# Patient Record
Sex: Female | Born: 1975
Health system: Southern US, Community
[De-identification: ages and names within clinical notes are randomized; demographics above are authoritative.]

## PROBLEM LIST (undated history)

## (undated) DIAGNOSIS — F32A Depression, unspecified: Secondary | ICD-10-CM

## (undated) DIAGNOSIS — Z87442 Personal history of urinary calculi: Secondary | ICD-10-CM

## (undated) DIAGNOSIS — I10 Essential (primary) hypertension: Secondary | ICD-10-CM

## (undated) DIAGNOSIS — F419 Anxiety disorder, unspecified: Secondary | ICD-10-CM

## (undated) DIAGNOSIS — R0602 Shortness of breath: Secondary | ICD-10-CM

## (undated) DIAGNOSIS — R5383 Other fatigue: Secondary | ICD-10-CM

## (undated) DIAGNOSIS — N200 Calculus of kidney: Secondary | ICD-10-CM

## (undated) HISTORY — DX: Other fatigue: R53.83

## (undated) HISTORY — DX: Shortness of breath: R06.02

## (undated) HISTORY — DX: Calculus of kidney: N20.0

## (undated) HISTORY — DX: Anxiety disorder, unspecified: F41.9

## (undated) HISTORY — DX: Depression, unspecified: F32.A

## (undated) HISTORY — DX: Essential (primary) hypertension: I10

---

## 2000-10-18 ENCOUNTER — Other Ambulatory Visit: Admission: RE | Admit: 2000-10-18 | Discharge: 2000-10-18 | Payer: Self-pay | Admitting: Obstetrics & Gynecology

## 2000-12-12 ENCOUNTER — Other Ambulatory Visit: Admission: RE | Admit: 2000-12-12 | Discharge: 2000-12-12 | Payer: Self-pay | Admitting: Obstetrics & Gynecology

## 2001-02-18 ENCOUNTER — Encounter: Admission: RE | Admit: 2001-02-18 | Discharge: 2001-03-13 | Payer: Self-pay | Admitting: Obstetrics & Gynecology

## 2001-05-20 ENCOUNTER — Inpatient Hospital Stay (HOSPITAL_COMMUNITY): Admission: AD | Admit: 2001-05-20 | Discharge: 2001-05-23 | Payer: Self-pay | Admitting: Obstetrics & Gynecology

## 2001-07-03 ENCOUNTER — Other Ambulatory Visit: Admission: RE | Admit: 2001-07-03 | Discharge: 2001-07-03 | Payer: Self-pay | Admitting: Obstetrics & Gynecology

## 2005-06-18 ENCOUNTER — Encounter: Admission: RE | Admit: 2005-06-18 | Discharge: 2005-06-18 | Payer: Self-pay | Admitting: Family Medicine

## 2005-07-02 ENCOUNTER — Encounter: Admission: RE | Admit: 2005-07-02 | Discharge: 2005-07-02 | Payer: Self-pay | Admitting: Gastroenterology

## 2007-04-03 ENCOUNTER — Other Ambulatory Visit: Admission: RE | Admit: 2007-04-03 | Discharge: 2007-04-03 | Payer: Self-pay | Admitting: Obstetrics and Gynecology

## 2008-05-03 HISTORY — PX: KIDNEY STONE SURGERY: SHX686

## 2008-05-22 ENCOUNTER — Emergency Department (HOSPITAL_COMMUNITY): Admission: EM | Admit: 2008-05-22 | Discharge: 2008-05-22 | Payer: Self-pay | Admitting: Emergency Medicine

## 2008-05-23 ENCOUNTER — Emergency Department (HOSPITAL_COMMUNITY): Admission: EM | Admit: 2008-05-23 | Discharge: 2008-05-23 | Payer: Self-pay | Admitting: Emergency Medicine

## 2008-05-25 ENCOUNTER — Ambulatory Visit (HOSPITAL_COMMUNITY): Admission: RE | Admit: 2008-05-25 | Discharge: 2008-05-25 | Payer: Self-pay | Admitting: Urology

## 2008-05-28 ENCOUNTER — Observation Stay (HOSPITAL_COMMUNITY): Admission: EM | Admit: 2008-05-28 | Discharge: 2008-05-29 | Payer: Self-pay | Admitting: Emergency Medicine

## 2008-05-29 ENCOUNTER — Encounter (INDEPENDENT_AMBULATORY_CARE_PROVIDER_SITE_OTHER): Payer: Self-pay | Admitting: Urology

## 2008-05-31 ENCOUNTER — Ambulatory Visit (HOSPITAL_COMMUNITY): Admission: RE | Admit: 2008-05-31 | Discharge: 2008-05-31 | Payer: Self-pay | Admitting: Emergency Medicine

## 2008-06-03 ENCOUNTER — Emergency Department (HOSPITAL_COMMUNITY): Admission: EM | Admit: 2008-06-03 | Discharge: 2008-06-03 | Payer: Self-pay | Admitting: Emergency Medicine

## 2009-02-01 ENCOUNTER — Other Ambulatory Visit: Admission: RE | Admit: 2009-02-01 | Discharge: 2009-02-01 | Payer: Self-pay | Admitting: Gynecology

## 2009-02-01 ENCOUNTER — Ambulatory Visit: Payer: Self-pay | Admitting: Gynecology

## 2009-02-01 ENCOUNTER — Encounter: Payer: Self-pay | Admitting: Gynecology

## 2009-09-23 IMAGING — CT CT PELVIS W/O CM
1 of 2 series · 13 of 32 positions shown, 19 images · non-contrast
Comparison: 07/02/2005

 CT ABDOMEN

May 24, 2008 – Duplicate copy for exam association in [facility]. No change to original report.
CLINICAL DATA: Flank pain

 CT ABDOMEN AND PELVIS WITHOUT CONTRAST
TECHNIQUE: Multidetector CT imaging of the abdomen and pelvis was
 performed following the standard
 protocol without intravenous contrast.

[Series 2: abd|pel w/o 5.0 b40f · axial · non-contrast · 0.83mm/px · z∈[+444,+834]mm · 13 of 92 slices shown, 19 images]
[im 7/92  soft-tissue]
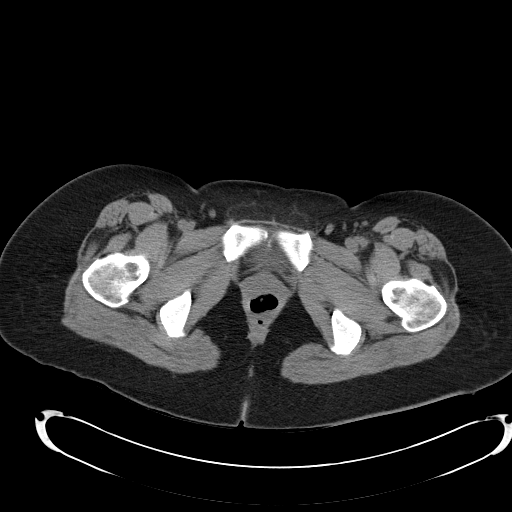
[im 7/92  bone]
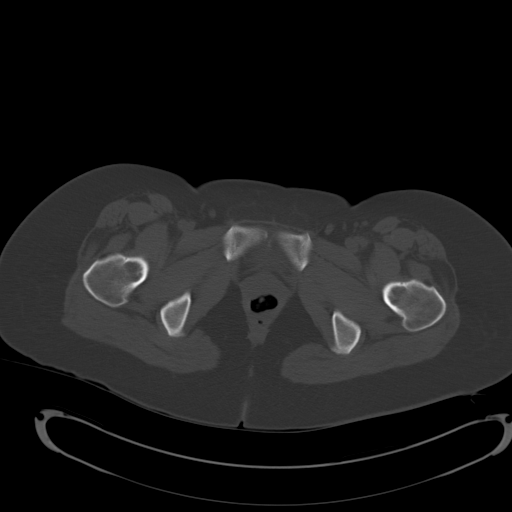
[im 13/92  soft-tissue]
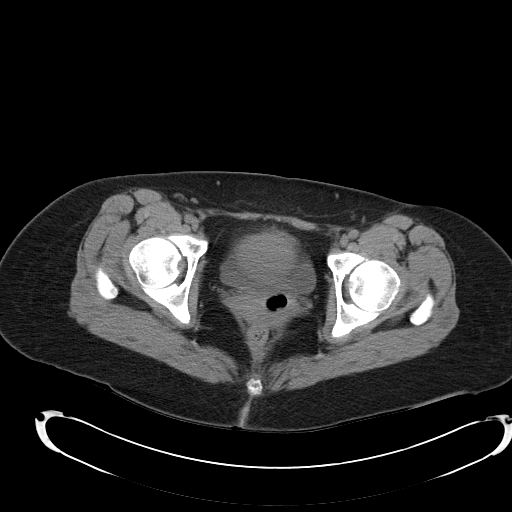
[im 19/92  soft-tissue]
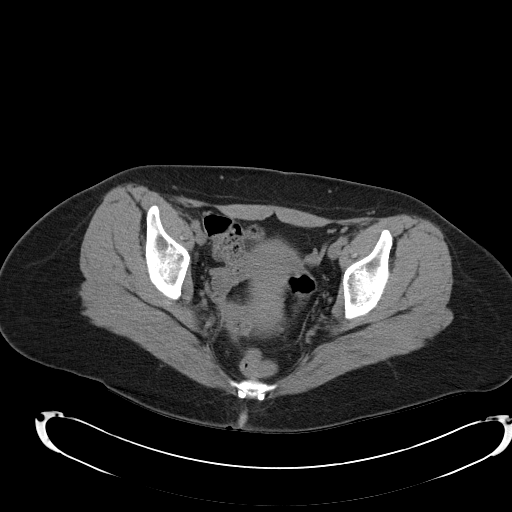
[im 25/92  soft-tissue]
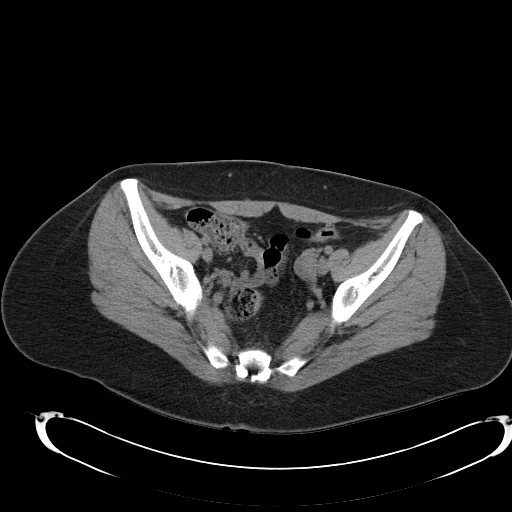
[im 31/92  soft-tissue]
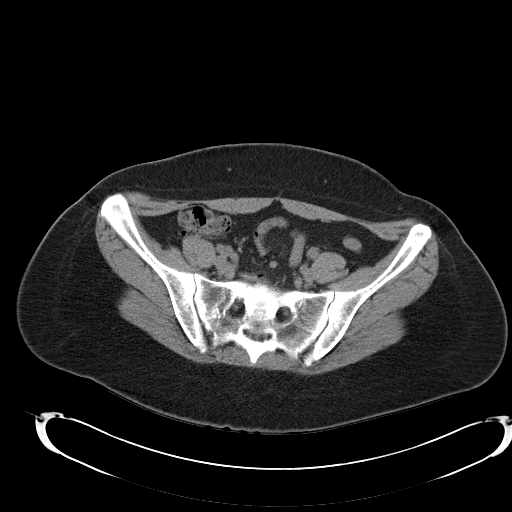
[im 37/92  soft-tissue]
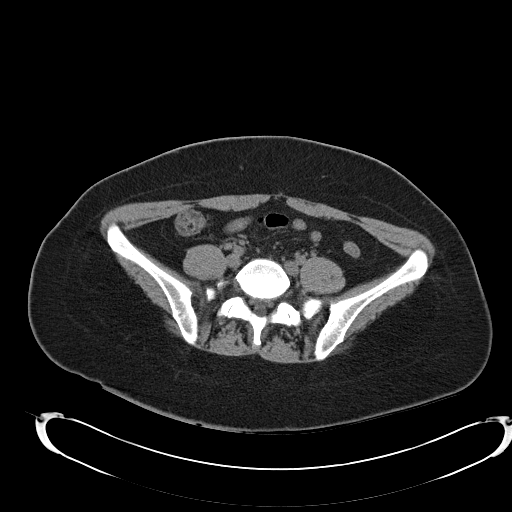
[im 49/92  soft-tissue]
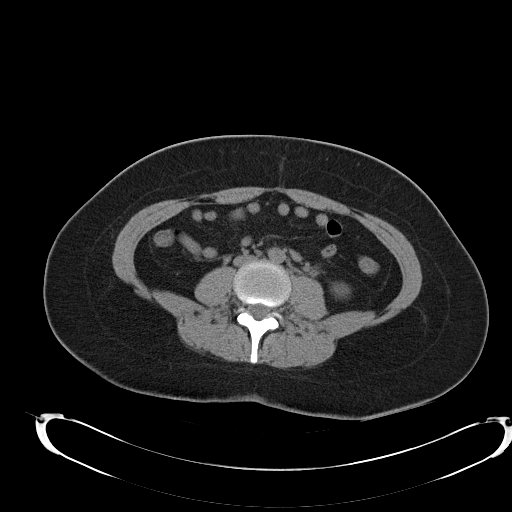
[im 55/92  soft-tissue]
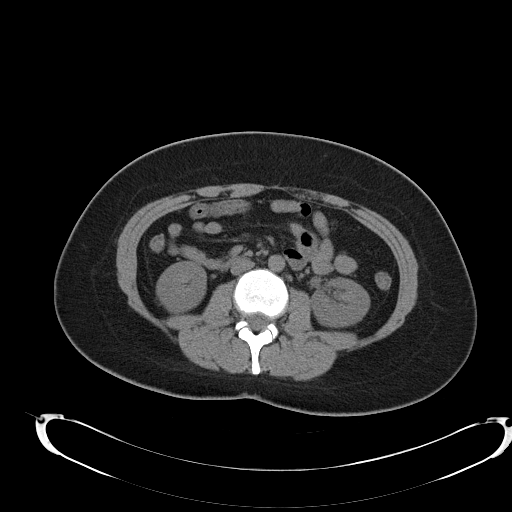
[im 61/92  soft-tissue]
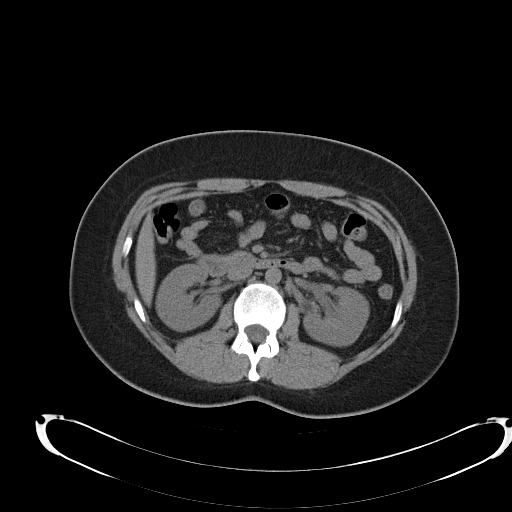
[im 61/92  bone]
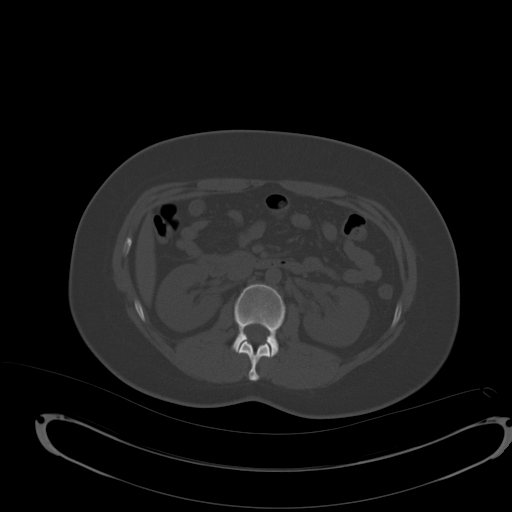
[im 67/92  soft-tissue]
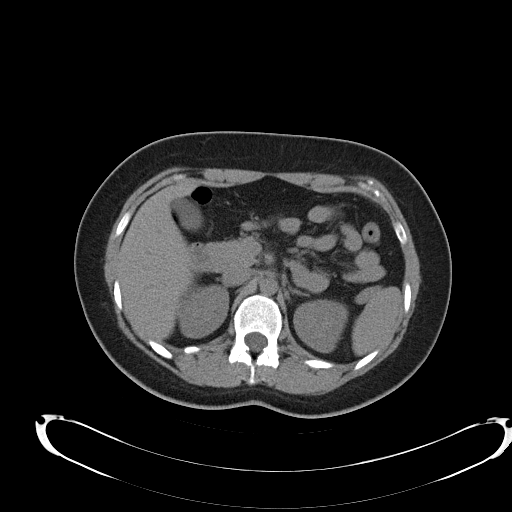
[im 67/92  lung]
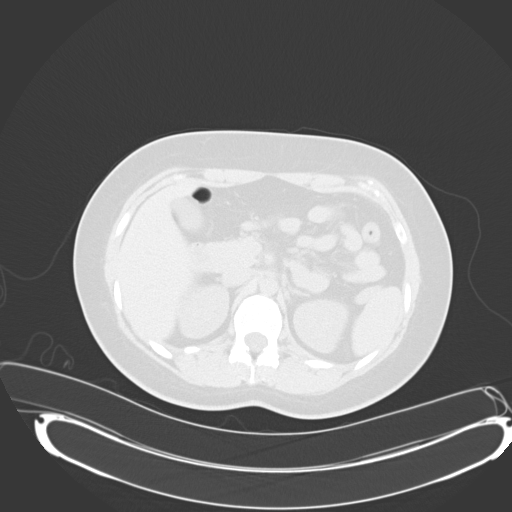
[im 73/92  soft-tissue]
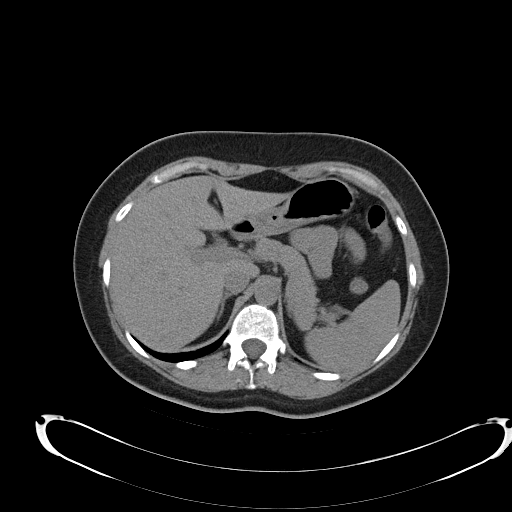
[im 73/92  lung]
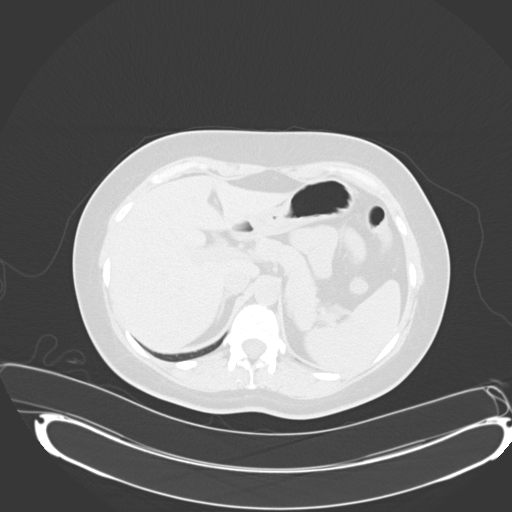
[im 79/92  soft-tissue]
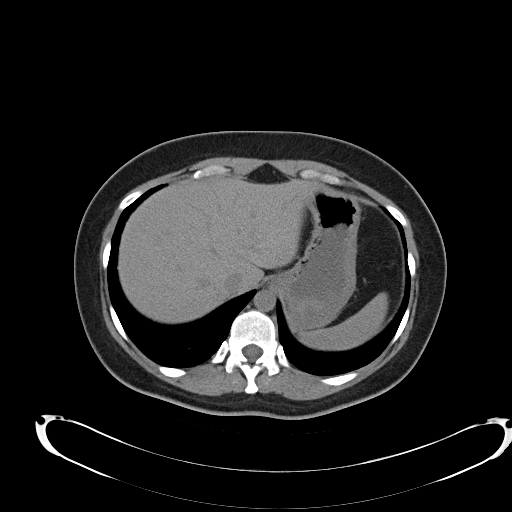
[im 79/92  lung]
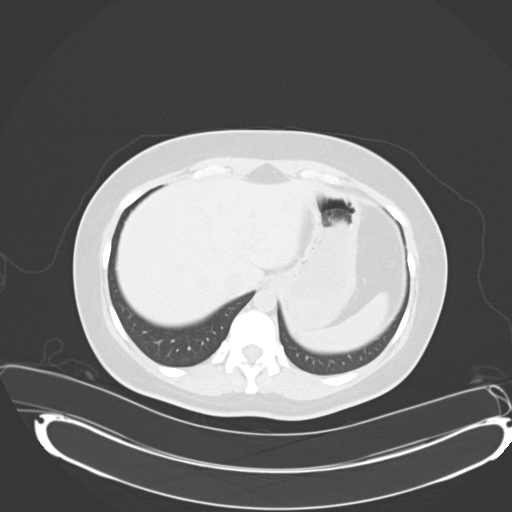
[im 85/92  soft-tissue]
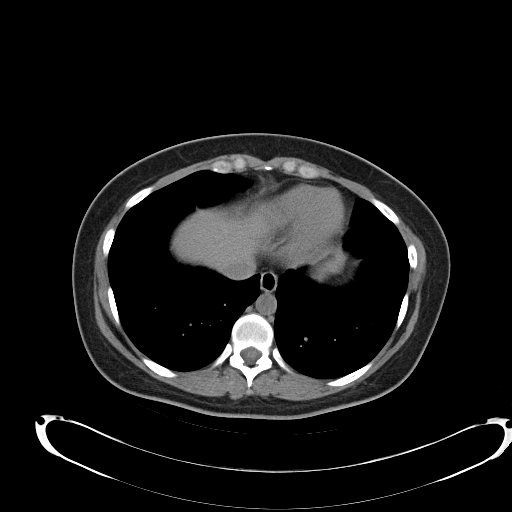
[im 85/92  lung]
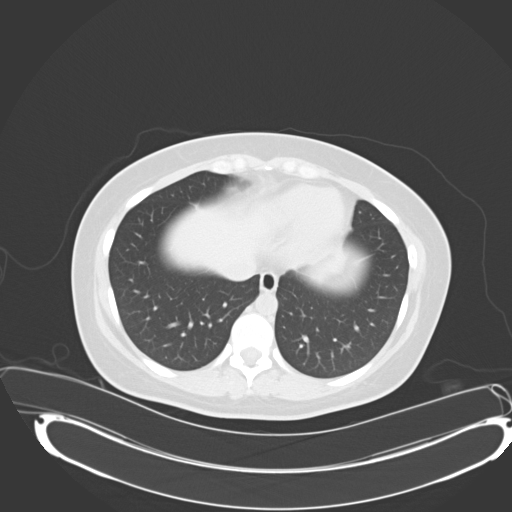

[13 of 32 positions shown; findings below may reference images not displayed]

FINDINGS: Visualized lung bases are clear.

 Liver is normal.

 Spleen is normal.

 The pancreas is normal.

 The adrenal glands are normal.

 There is a left-sided hydronephrosis. Within the proximal ureter
 there is a 4.5 mm stone near the UPJ.

 The right kidney is negative.

 Gallbladder is unremarkable by CT.

 No biliary ductal dilation.

 Stomach and visualized large and small bowel are unremarkable.

 Abdominal aorta normal is in caliber.

 No significant lymphadenopathy.

 No free fluid or abnormal fluid collections..
IMPRESSION: 1. Left-sided hydronephrosis secondary to a 4.5 mm left ureteral
 stone near the UPJ base.

 CT PELVIS
FINDINGS: Appendix identified and normal.

 Visualized colon and small bowel are unremarkable.

 No free fluid or abnormal fluid collections.

 No significant lymphadenopathy.

 Urinary bladder is normal.

 The uterus, and the adnexal structures are unremarkable. A tampon
 is identified within the vagina
IMPRESSION: 1. No acute pelvic CT findings

## 2010-02-09 ENCOUNTER — Other Ambulatory Visit: Admission: RE | Admit: 2010-02-09 | Discharge: 2010-02-09 | Payer: Self-pay | Admitting: Gynecology

## 2010-02-09 ENCOUNTER — Ambulatory Visit: Payer: Self-pay | Admitting: Gynecology

## 2010-12-24 ENCOUNTER — Encounter: Payer: Self-pay | Admitting: Gastroenterology

## 2011-04-17 NOTE — H&P (Signed)
NAMECHANCI, OJALA                ACCOUNT NO.:  [id number]   MEDICAL RECORD NO.:  [id number]          PATIENT TYPE:  OBV   LOCATION:  A326                          FACILITY:  [facility]   PHYSICIAN:  Dennie Maizes, M.D.   DATE OF BIRTH:  Apr 05, 1976   DATE OF ADMISSION:  05/28/2008  DATE OF DISCHARGE:  LH                              HISTORY & PHYSICAL   CHIEF COMPLAINT:  Severe left flank pain radiating to the front, nausea,  and vomiting.   HISTORY OF PRESENT ILLNESS:  This 35 year old female has been  experiencing intermittent severe left flank pain radiating to the front  for the past 1 week.  She has been to the emergency room x2.  She has  been evaluated with a CT scan of the abdomen and pelvis without contrast  as well as an x-ray of the KUB area.  The last x-ray revealed a 6 mm  size stone in the pelvic brim on the left side.  The patient was seen in  the office and treated with p.o. analgesics.  She did not have adequate  relief of pain with Percocet.  She started having severe recurrent pain  associated with nausea and vomiting today.  She came to the emergency  room for further evaluation.  An x-ray of the KUB area was repeated.  This revealed a 6 mm size stone in the left distal ureter at the level  of the ureterovesical junction.  The patient has been admitted to  hospital for pain control, IV fluids, and further treatment.  There is  no history of fever, chills, voiding difficulty, or gross hematuria at  present.  There is no past history of urolithiasis or urinary tract  infections.   PAST MEDICAL HISTORY:  No medical illnesses   MEDICATIONS:  Percocet.   ALLERGIES:  CODEINE.   FAMILY HISTORY:  Positive for cholelithiasis.   PHYSICAL EXAMINATION:  GENERAL:  The patient is in moderate discomfort.  She has received narcotics.  HEENT:  Normal.  NECK:  No masses.  LUNGS:  Clear to auscultation.  HEART:  Regular rate and rhythm.  No murmurs.  ABDOMEN:  Soft.  No  palpable flank mass.  Mild left costovertebral angle  tenderness is noted.  Bladder not palpable.   Urinalysis revealed a microscopic hematuria.   IMPRESSION:  Left distal ureteral calculus with obstruction, left  hydronephrosis, left renal colic.   PLAN:  I discussed the patient regarding the management options.  She  would like surgical removal of the stone.  She is scheduled to undergo  cystoscopy, left retrograde pyelogram, left ureteroscopy stone  extraction, and stent placement under anesthesia on May 29, 2008.  I  have explained to her regarding the diagnosis, operative details,  alternative treatments, outcome, possible risks and complications, and  she has agreed for the procedure to be done.  Stone migration during the  procedure as well as inability to remove the stone were explained to the  patient.  Her questions were answered.      Dennie Maizes, M.D.  Electronically Signed     SK/MEDQ  D:  05/28/2008  T:  05/28/2008  Job:  [id number]

## 2011-04-17 NOTE — Op Note (Signed)
Alison Lucas, Alison Lucas                ACCOUNT NO.:  [id number]   MEDICAL RECORD NO.:  [id number]          PATIENT TYPE:  OBV   LOCATION:  A326                          FACILITY:  [facility]   PHYSICIAN:  Dennie Maizes, M.D.   DATE OF BIRTH:  Jan 24, 1976   DATE OF PROCEDURE:  05/29/2008  DATE OF DISCHARGE:  05/29/2008                               OPERATIVE REPORT   PREOPERATIVE DIAGNOSES:  1. Left distal ureteral calculus with obstruction.  2. Left hydronephrosis.  3. Left renal colic.   POSTOPERATIVE DIAGNOSES:  1. Left distal ureteral calculus with obstruction.  2. Left hydronephrosis.  3. Left renal colic.   OPERATIVE PROCEDURE:  Cystoscopy, left retrograde pyelogram, left  ureteroscopy stone extraction, and left ureteral stent placement.   ANESTHESIA:  General.   SURGEON:  Dennie Maizes, MD.   COMPLICATIONS:  None.   ESTIMATED BLOOD LOSS:  Minimal.   SPECIMEN:  Left ureteral stone which was sent for chemical analysis.   INDICATIONS FOR PROCEDURE:  This is a 35 year old female who was  admitted to the hospital for severe left flank pain.  X-rays revealed a  3 x 5-mm size left distal ureteral calculi with obstruction and  hydronephrosis.  The patient was unable to pass the stone.  She was  taken to the operating room today for cystoscopy, left retrograde  pyelogram, left ureteroscopy, stone extraction, and left ureteral stent  placement.   DESCRIPTION OF PROCEDURE:  General anesthesia was induced and the  patient was placed on the OR table in the dorsal lithotomy position.  The lower abdomen and genitalia were prepped and draped in a sterile  fashion.  Cystoscopy was done with a 23-French scope.  Appearance of  bladder was normal.  A 5-French wedge catheter was then placed in the  left ureteral orifice and retrograde pyelogram was done by using about 7  mL of Renografin-60.  There was a filling defect in the distal ureter  about 6 x 5 mm in size.  There was proximal  hydroureter and  hydronephrosis.   A 5-French open-ended catheter was then placed in the left distal  ureter.  A 0.138-gauge Bentson guide with a flexible tip was then  advanced in the left renal pelvis.  Distal ureter was dilated using an  18-French balloon dilating catheter.  The balloon dilating catheter was  then removed leaving the guidewire in place.  Ureteroscopy was done with  a rigid 8.5 French rigid ureteroscope..  The stone was seen at about 5  cm above ureteral orifice.  The stone was engaged in a nitinol wire  basket and removed without any difficulty.  Examination of the ureter  was nodular pelvic brim and no other abnormality was noted.  A 6-French  26-cm size stent with a string was then inserted into the left  collecting system.  Cystoscope was removed.  The patient was transferred  to the PACU in a satisfactory condition.      Dennie Maizes, M.D.  Electronically Signed     SK/MEDQ  D:  05/29/2008  T:  05/29/2008  Job:  [id number]

## 2011-08-30 LAB — BASIC METABOLIC PANEL
BUN: 2 — ABNORMAL LOW
BUN: 8
BUN: 11
CO2: 27
CO2: 31
Chloride: 108
Chloride: 107
Creatinine, Ser: 0.69
GFR calc Af Amer: >60
GFR calc non Af Amer: >60
Glucose, Bld: 112 — ABNORMAL HIGH
Potassium: 3.9
Potassium: 3.9
Sodium: 142
Sodium: 145

## 2011-08-30 LAB — URINE MICROSCOPIC-ADD ON

## 2011-08-30 LAB — URINALYSIS, ROUTINE W REFLEX MICROSCOPIC
Bilirubin Urine: NEGATIVE
Glucose, UA: NEGATIVE
Ketones, ur: NEGATIVE
Leukocytes, UA: NEGATIVE
Leukocytes, UA: NEGATIVE
Nitrite: NEGATIVE
Protein, ur: 30 — AB
Specific Gravity, Urine: 1.015
pH: 7.5
pH: 7

## 2011-08-30 LAB — CBC
HCT: 36.4
MCHC: 34.2
MCHC: 34.8
MCHC: 33.6
MCV: 93.3
MCV: 95
Platelets: 257
RDW: 12.3
RDW: 12.4
RDW: 11.7
WBC: 10.9 — ABNORMAL HIGH

## 2011-08-30 LAB — DIFFERENTIAL
Basophils Relative: 1
Eosinophils Absolute: 0.4
Eosinophils Relative: 4
Lymphocytes Relative: 12
Lymphocytes Relative: 30
Lymphocytes Relative: 22
Lymphs Abs: 1.2
Lymphs Abs: 2.6
Lymphs Abs: 2.5
Monocytes Relative: 5
Neutro Abs: 9 — ABNORMAL HIGH
Neutro Abs: 7.7
Neutrophils Relative %: 68

## 2011-08-30 LAB — STONE ANALYSIS

## 2012-06-04 ENCOUNTER — Encounter: Payer: Self-pay | Admitting: Gynecology

## 2012-06-04 ENCOUNTER — Ambulatory Visit (INDEPENDENT_AMBULATORY_CARE_PROVIDER_SITE_OTHER): Payer: PRIVATE HEALTH INSURANCE | Admitting: Gynecology

## 2012-06-04 ENCOUNTER — Other Ambulatory Visit (HOSPITAL_COMMUNITY)
Admission: RE | Admit: 2012-06-04 | Discharge: 2012-06-04 | Disposition: A | Discharge status: Home / Self Care | Payer: PRIVATE HEALTH INSURANCE | Source: Ambulatory Visit | Attending: Gynecology | Admitting: Gynecology

## 2012-06-04 VITALS — BP 114/76 | Ht 66.5 in | Wt 176.0 lb

## 2012-06-04 DIAGNOSIS — Z113 Encounter for screening for infections with a predominantly sexual mode of transmission: Secondary | ICD-10-CM

## 2012-06-04 DIAGNOSIS — Z13220 Encounter for screening for lipoid disorders: Secondary | ICD-10-CM

## 2012-06-04 DIAGNOSIS — N926 Irregular menstruation, unspecified: Secondary | ICD-10-CM

## 2012-06-04 DIAGNOSIS — Z01419 Encounter for gynecological examination (general) (routine) without abnormal findings: Secondary | ICD-10-CM | POA: Insufficient documentation

## 2012-06-04 DIAGNOSIS — Z131 Encounter for screening for diabetes mellitus: Secondary | ICD-10-CM

## 2012-06-04 DIAGNOSIS — N920 Excessive and frequent menstruation with regular cycle: Secondary | ICD-10-CM

## 2012-06-04 DIAGNOSIS — Z1159 Encounter for screening for other viral diseases: Secondary | ICD-10-CM | POA: Insufficient documentation

## 2012-06-04 LAB — LIPID PANEL
HDL: 49 mg/dL (ref >39)
LDL Cholesterol: 105 mg/dL — ABNORMAL HIGH (ref 0–99)
Total CHOL/HDL Ratio: 3.4 Ratio
VLDL: 14 mg/dL (ref 0–40)

## 2012-06-04 LAB — CBC WITH DIFFERENTIAL/PLATELET
Basophils Absolute: 0 10*3/uL (ref 0.0–0.1)
Basophils Relative: 1 % (ref 0–1)
Eosinophils Absolute: 0.3 10*3/uL (ref 0.0–0.7)
HCT: 40.4 % (ref 36.0–46.0)
MCH: 32 pg (ref 26.0–34.0)
MCHC: 34.7 g/dL (ref 30.0–36.0)
Monocytes Absolute: 0.4 10*3/uL (ref 0.1–1.0)
Neutro Abs: 3.7 10*3/uL (ref 1.7–7.7)
RDW: 12.8 % (ref 11.5–15.5)

## 2012-06-04 LAB — HCG, SERUM, QUALITATIVE: Preg, Serum: NEGATIVE

## 2012-06-04 NOTE — Progress Notes (Signed)
Alison Lucas 1976-03-03 [id number]        36 y.o.  for annual exam.  Several issues noted below.  Past medical history,surgical history, medications, allergies, family history and social history were all reviewed and documented in the EPIC chart. ROS:  Was performed and pertinent positives and negatives are included in the history.  Exam: Sherrilyn Rist assistant Filed Vitals:   06/04/12 1049  BP: 114/76   General appearance  Normal Skin grossly normal Head/Neck normal with no cervical or supraclavicular adenopathy thyroid normal Lungs  clear Cardiac RR, without RMG Abdominal  soft, nontender, without masses, organomegaly or hernia Breasts  examined lying and sitting without masses, retractions, discharge or axillary adenopathy. Pelvic  Ext/BUS/vagina  normal   Cervix  normal Pap/HPV, GC/Chlamydia  Uterus  anteverted, normal size, shape and contour, midline and mobile nontender   Adnexa  Without masses or tenderness    Anus and perineum  normal   Rectovaginal  normal sphincter tone without palpated masses or tenderness.    Assessment/Plan:  36 y.o. G1 P1 female vasectomy birth control for annual exam.   1. Menorrhagia/irregular menses. Patient notes her periods have become irregular and heavy. She will go up to 2 months without menses and bleed for up to 2 weeks at a time. Frequently requires double protection/bleedthrough episodes. Vasectomy birth control. Had been on oral contraceptives but stopped these almost 2 years ago. Does note weight gain, without skin changes hair changes or other symptoms. We'll check baseline FSH prolactin TSH hCG and sonohysterogram. I reviewed differential to include hormonal irregularity/structural such as polyps myomas. Patient will follow up for lab results and ultrasound and we'll go from there. Various options to include hormonal manipulation such as low-dose pills, progesterone only, Mirena IUD, endometrial ablation reviewed. 2. STD screening. Patient  request GC Chlamydia screen with no known exposure history. 3. Pap smear. Last Pap smear 2011. Pap/HPV done today. No history of abnormal Paps before. If normal then plan every 5 year interval per current screening guidelines.. 4. Mammography. Reviewed screening mammogram recommendations between 35 and 40. She has no strong family history and prefers to wait closer to 40. SBE monthly reviewed. 5. Weight gain. Patient asked about prescription weight loss pills. I reviewed diet and exercise recommendations and stressed the need to increase exercise on regular basis with dietary discretion. 6. Health maintenance. Baseline CBC lipid profile glucose urinalysis ordered along with her other blood work.     Dara Lords MD, 11:25 AM 06/04/2012

## 2012-06-04 NOTE — Patient Instructions (Signed)
Follow up for ultrasound and blood work results.

## 2012-06-05 LAB — PROLACTIN: Prolactin: 10.3 ng/mL

## 2012-06-05 LAB — URINALYSIS W MICROSCOPIC + REFLEX CULTURE
Bacteria, UA: NONE SEEN
Bilirubin Urine: NEGATIVE
Casts: NONE SEEN
Glucose, UA: NEGATIVE mg/dL
Hgb urine dipstick: NEGATIVE
Ketones, ur: NEGATIVE mg/dL
Protein, ur: NEGATIVE mg/dL
pH: 5.5 (ref 5.0–8.0)

## 2012-06-05 LAB — GC/CHLAMYDIA PROBE AMP, GENITAL: Chlamydia, DNA Probe: NEGATIVE

## 2012-06-16 ENCOUNTER — Telehealth: Payer: Self-pay | Admitting: *Deleted

## 2012-06-16 NOTE — Telephone Encounter (Signed)
Pt canceled her [facility] appointment on 06/20/12 due to the cost with her insurance. Pt would like to know what other options does she have? Or should pt come back ov to discuss? Please advise

## 2012-06-17 MED ORDER — NORETHINDRONE ACET-ETHINYL EST 1-20 MG-MCG PO TABS: 1.0000 | ORAL_TABLET | Freq: Every day | ORAL | Status: DC

## 2012-06-17 NOTE — Telephone Encounter (Signed)
Pt informed with the below. 

## 2012-06-17 NOTE — Telephone Encounter (Signed)
Loestrin 120 equivalent prescribed x12

## 2012-06-17 NOTE — Telephone Encounter (Signed)
Pt informed with all the below note, she would like to start on low dose pill. She will try this for a couple of months and then follow up.

## 2012-06-17 NOTE — Telephone Encounter (Signed)
The issue with not doing the ultrasound as I cannot guarantee that she does not have a polyp or other structural abnormality. Unlikely that she would have significant atypia or precancerous changes as a cause given her age but that is in the differential. If she declines ultrasound and accepts the above disclaimer one option would be to start on a low-dose oral contraceptive to see if she does not get some menstrual regulation or consider a Mirena IUD. All of her lab work was normal which shows a she does not have a significant hormonal abnormality.  Risks with the pill include blood clot such as stroke heart attack deep venous thromboses. The risk of that an otherwise healthy woman who does not smoke, being followed for blood pressure diabetes is very low. She is more than welcome to come back in and talked about options. Or she can decide and try the low-dose pill which I can prescribe for her.

## 2012-06-20 ENCOUNTER — Other Ambulatory Visit: Payer: PRIVATE HEALTH INSURANCE

## 2012-06-20 ENCOUNTER — Ambulatory Visit: Payer: PRIVATE HEALTH INSURANCE | Admitting: Gynecology

## 2013-03-27 ENCOUNTER — Other Ambulatory Visit: Payer: Self-pay | Admitting: *Deleted

## 2013-03-27 MED ORDER — NORETHINDRONE ACET-ETHINYL EST 1-20 MG-MCG PO TABS: 1.0000 | ORAL_TABLET | Freq: Every day | ORAL | Status: DC

## 2013-07-24 ENCOUNTER — Encounter: Payer: Self-pay | Admitting: Gynecology

## 2013-07-24 ENCOUNTER — Ambulatory Visit (INDEPENDENT_AMBULATORY_CARE_PROVIDER_SITE_OTHER): Payer: 59 | Admitting: Gynecology

## 2013-07-24 VITALS — BP 122/76 | Ht 65.5 in | Wt 184.0 lb

## 2013-07-24 DIAGNOSIS — Z13220 Encounter for screening for lipoid disorders: Secondary | ICD-10-CM

## 2013-07-24 DIAGNOSIS — Z01419 Encounter for gynecological examination (general) (routine) without abnormal findings: Secondary | ICD-10-CM

## 2013-07-24 LAB — CBC WITH DIFFERENTIAL/PLATELET
Basophils Absolute: 0 10*3/uL (ref 0.0–0.1)
Basophils Relative: 0 % (ref 0–1)
Lymphocytes Relative: 29 % (ref 12–46)
MCHC: 34.2 g/dL (ref 30.0–36.0)
Monocytes Absolute: 0.6 10*3/uL (ref 0.1–1.0)
Neutro Abs: 5.7 10*3/uL (ref 1.7–7.7)
Platelets: 289 10*3/uL (ref 150–400)
RDW: 13.1 % (ref 11.5–15.5)
WBC: 9.4 10*3/uL (ref 4.0–10.5)

## 2013-07-24 LAB — COMPREHENSIVE METABOLIC PANEL
ALT: 14 U/L (ref 0–35)
AST: 15 U/L (ref 0–37)
Alkaline Phosphatase: 79 U/L (ref 39–117)
BUN: 8 mg/dL (ref 6–23)
Calcium: 8.8 mg/dL (ref 8.4–10.5)
Chloride: 105 mEq/L (ref 96–112)
Creat: 0.62 mg/dL (ref 0.50–1.10)
Potassium: 4.5 mEq/L (ref 3.5–5.3)

## 2013-07-24 LAB — LIPID PANEL
HDL: 44 mg/dL (ref >39)
LDL Cholesterol: 100 mg/dL — ABNORMAL HIGH (ref 0–99)
Total CHOL/HDL Ratio: 4 Ratio

## 2013-07-24 MED ORDER — NORETHINDRONE ACET-ETHINYL EST 1-20 MG-MCG PO TABS: 1.0000 | ORAL_TABLET | Freq: Every day | ORAL | Status: DC

## 2013-07-24 NOTE — Patient Instructions (Signed)
Follow up in one year, sooner as needed. 

## 2013-07-24 NOTE — Progress Notes (Signed)
Alison Lucas 06-Sep-1976 [id number]        37 y.o.  G1P1 for annual exam.  Doing well without complaints.  Past medical history,surgical history, medications, allergies, family history and social history were all reviewed and documented in the EPIC chart.  ROS:  Performed and pertinent positives and negatives are included in the history, assessment and plan .  Exam: Alison Lucas Filed Vitals:   07/24/13 1416  BP: 122/76  Height: 5' 5.5" (1.664 m)  Weight: 184 lb (83.462 kg)   General appearance  Normal Skin grossly normal Head/Neck normal with no cervical or supraclavicular adenopathy thyroid normal Lungs  clear Cardiac RR, without RMG Abdominal  soft, nontender, without masses, organomegaly or hernia Breasts  examined lying and sitting without masses, retractions, discharge or axillary adenopathy. Pelvic  Ext/BUS/vagina  normal  Cervix  normal  Uterus  anteverted, normal size, shape and contour, midline and mobile nontender   Adnexa  Without masses or tenderness    Anus and perineum  normal   Rectovaginal  normal sphincter tone without palpated masses or tenderness.    Assessment/Plan:  37 y.o. G1P1 female for annual exam, regular menses, oral contraceptives/vasectomy.   1. Patient taking oral contraceptives for menstrual regulation. Had normal blood work to include TSH FSH prolactin. Never followed up for ultrasound as recommended because of insurance and payment issues. I reviewed his optimum to proceed with the ultrasound but given now that she is on the pills and having regular better menses and we could hold on this for now. Husband does have a vasectomy. Risks of pills to include stroke heart attack DVT reviewed. Patient does not smoke or being followed for any medical issues. I refilled her pills x1 year. 2. Mammography. Screening mammographic recommendations between 35 and 40 reviewed. She has a strong family history personally closer to 40. SBE monthly reviewed. 3. Pap  smear/HPV negative 2013. No history of significant abnormal Pap smears. Plan repeat at 3-5 year interval. 4. Health maintenance. Baseline CBC comprehensive metabolic panel lipid profile urinalysis ordered. Followup in one year, sooner as needed.  Note: This document was prepared with digital dictation and possible smart phrase technology. Any transcriptional errors that result from this process are unintentional.   Dara Lords MD, 2:49 PM 07/24/2013

## 2013-07-25 LAB — URINALYSIS W MICROSCOPIC + REFLEX CULTURE
Hgb urine dipstick: NEGATIVE
Leukocytes, UA: NEGATIVE
Nitrite: NEGATIVE
Protein, ur: NEGATIVE mg/dL
Urobilinogen, UA: 0.2 mg/dL (ref 0.0–1.0)

## 2013-07-27 ENCOUNTER — Other Ambulatory Visit: Payer: Self-pay | Admitting: Gynecology

## 2013-07-27 LAB — URINE CULTURE: Colony Count: 35000

## 2013-07-27 MED ORDER — AMPICILLIN 500 MG PO CAPS: 500.0000 mg | ORAL_CAPSULE | Freq: Four times a day (QID) | ORAL | Status: DC

## 2014-10-04 ENCOUNTER — Encounter: Payer: Self-pay | Admitting: Gynecology

## 2016-02-09 DIAGNOSIS — R5383 Other fatigue: Secondary | ICD-10-CM | POA: Diagnosis not present

## 2016-02-09 DIAGNOSIS — R635 Abnormal weight gain: Secondary | ICD-10-CM | POA: Diagnosis not present

## 2016-02-09 DIAGNOSIS — E559 Vitamin D deficiency, unspecified: Secondary | ICD-10-CM | POA: Diagnosis not present

## 2016-02-09 DIAGNOSIS — R0602 Shortness of breath: Secondary | ICD-10-CM | POA: Diagnosis not present

## 2016-02-09 DIAGNOSIS — Z79899 Other long term (current) drug therapy: Secondary | ICD-10-CM | POA: Diagnosis not present

## 2016-02-17 DIAGNOSIS — R635 Abnormal weight gain: Secondary | ICD-10-CM | POA: Diagnosis not present

## 2016-02-17 MED FILL — PHENTERMINE 37.5 MG TABLET: 37.5 | 30 days supply | Qty: 30 | Fill #0

## 2016-03-15 DIAGNOSIS — R635 Abnormal weight gain: Secondary | ICD-10-CM | POA: Diagnosis not present

## 2016-03-15 DIAGNOSIS — Z79899 Other long term (current) drug therapy: Secondary | ICD-10-CM | POA: Diagnosis not present

## 2016-03-15 DIAGNOSIS — H5213 Myopia, bilateral: Secondary | ICD-10-CM | POA: Diagnosis not present

## 2016-03-15 DIAGNOSIS — H52223 Regular astigmatism, bilateral: Secondary | ICD-10-CM | POA: Diagnosis not present

## 2016-03-16 MED FILL — PHENTERMINE 37.5 MG TABLET: 37.5 | 30 days supply | Qty: 30 | Fill #0

## 2016-04-27 DIAGNOSIS — Z79899 Other long term (current) drug therapy: Secondary | ICD-10-CM | POA: Diagnosis not present

## 2016-04-27 DIAGNOSIS — R635 Abnormal weight gain: Secondary | ICD-10-CM | POA: Diagnosis not present

## 2016-04-27 MED FILL — PHENTERMINE 37.5 MG TABLET: 37.5 | 30 days supply | Qty: 30 | Fill #0

## 2017-09-17 ENCOUNTER — Other Ambulatory Visit: Payer: Self-pay | Admitting: *Deleted

## 2017-12-26 ENCOUNTER — Ambulatory Visit: Payer: Managed Care, Other (non HMO) | Admitting: Osteopathic Medicine

## 2017-12-26 ENCOUNTER — Encounter: Payer: Self-pay | Admitting: Osteopathic Medicine

## 2017-12-26 VITALS — BP 133/85 | HR 73 | Temp 98.0°F | Ht 66.0 in | Wt 195.0 lb

## 2017-12-26 DIAGNOSIS — Z113 Encounter for screening for infections with a predominantly sexual mode of transmission: Secondary | ICD-10-CM | POA: Diagnosis not present

## 2017-12-26 DIAGNOSIS — Z1239 Encounter for other screening for malignant neoplasm of breast: Secondary | ICD-10-CM

## 2017-12-26 DIAGNOSIS — Z8249 Family history of ischemic heart disease and other diseases of the circulatory system: Secondary | ICD-10-CM | POA: Diagnosis not present

## 2017-12-26 DIAGNOSIS — Z87442 Personal history of urinary calculi: Secondary | ICD-10-CM | POA: Diagnosis not present

## 2017-12-26 DIAGNOSIS — Z0000 Encounter for general adult medical examination without abnormal findings: Secondary | ICD-10-CM | POA: Diagnosis not present

## 2017-12-26 DIAGNOSIS — Z1231 Encounter for screening mammogram for malignant neoplasm of breast: Secondary | ICD-10-CM

## 2017-12-26 NOTE — Progress Notes (Signed)
HPI: Alison Lucas is a 42 y.o. female who  has a past medical history of Kidney stones.  she [facility] today, 12/26/17,  for chief complaint of:  Establish care   No medical problems to address, feeling well today. Has been sometime since a routine checkup and would just like to get this done.   Past medical, surgical, social and family history reviewed:  Patient Active Problem List   Diagnosis Date Noted  . History of kidney stones 12/26/2017   Past Surgical History:  Procedure Laterality Date  . KIDNEY STONE SURGERY  05/2008    Social History   Tobacco Use  . Smoking status: Never Smoker  . Smokeless tobacco: Never Used  Substance Use Topics  . Alcohol use: Yes    Comment: Rare    Family History  Problem Relation Age of Onset  . Hypertension Mother   . Diabetes Mother   . High blood pressure Mother   . Hypertension Sister   . Heart attack Maternal Grandfather   . High blood pressure Maternal Grandfather   . Bone cancer Paternal Grandfather      Current medication list and allergy/intolerance information reviewed:    No current outpatient medications on file.   No current facility-administered medications for this visit.     Allergies  Allergen Reactions  . Codeine Anaphylaxis      Review of Systems:  Constitutional:  No  fever, no chills, No recent illness, No unintentional weight changes. No significant fatigue.   HEENT: No  headache, no vision change, no hearing change, No sore throat, No  sinus pressure  Cardiac: No  chest pain, No  pressure, No palpitations, No  Orthopnea  Respiratory:  No  shortness of breath. No  Cough  Gastrointestinal: No  abdominal pain, No  nausea, No  vomiting,  No  blood in stool, No  diarrhea, No  constipation   Musculoskeletal: No new myalgia/arthralgia  Genitourinary: No  incontinence, No  abnormal genital bleeding, No abnormal genital discharge  Skin: No  Rash,  No other wounds/concerning lesions  Hem/Onc: No  easy bruising/bleeding, No  abnormal lymph node  Endocrine: No cold intolerance,  No heat intolerance. No polyuria/polydipsia/polyphagia   Neurologic: No  weakness, No  dizziness, No  slurred speech/focal weakness/facial droop  Psychiatric: No  concerns with depression, No  concerns with anxiety, No sleep problems, No mood problems  Exam:  BP 133/85   Pulse 73   Temp 98 F (36.7 C) (Oral)   Ht 5\' 6"  (1.676 m)   Wt 195 lb (88.5 kg)   LMP 12/21/2017   BMI 31.47 kg/m   Constitutional: VS see above. General Appearance: alert, well-developed, well-nourished, NAD  Eyes: Normal lids and conjunctive, non-icteric sclera  Ears, Nose, Mouth, Throat: MMM, Normal external inspection ears/nares/mouth/lips/gums. TM normal bilaterally. Pharynx/tonsils no erythema, no exudate. Nasal mucosa normal.   Neck: No masses, trachea midline. No thyroid enlargement. No tenderness/mass appreciated. No lymphadenopathy  Respiratory: Normal respiratory effort. no wheeze, no rhonchi, no rales  Cardiovascular: S1/S2 normal, no murmur, no rub/gallop auscultated. RRR.   Gastrointestinal: Nontender, no masses. No hepatomegaly, no splenomegaly. No hernia appreciated. Bowel sounds normal. Rectal exam deferred.   Musculoskeletal: Gait normal. No clubbing/cyanosis of digits.   Neurological: Normal balance/coordination. No tremor  Skin: warm, dry, intact. No rash/ulcer.    Psychiatric: Normal judgment/insight. Normal mood and affect. Oriented x3.      ASSESSMENT/PLAN:   Annual physical exam -  Plan: CBC, COMPLETE METABOLIC PANEL WITH GFR, Lipid panel, TSH, VITAMIN D 25 Hydroxy (Vit-D Deficiency, Fractures)  Breast cancer screening - Plan: MM DIGITAL SCREENING BILATERAL  History of kidney stones  Family history of heart disease - Plan: Lipid panel  Routine screening for STI (sexually transmitted infection) - Plan: C. trachomatis/N. gonorrhoeae RNA,  Hepatitis B surface antibody, Hepatitis B core antibody, total, HIV antibody, RPR, Hepatitis C Antibody   FEMALE PREVENTIVE CARE Updated 12/26/17   ANNUAL SCREENING/COUNSELING  Diet/Exercise - HEALTHY HABITS DISCUSSED TO DECREASE CV RISK Social History   Tobacco Use  Smoking Status Never Smoker  Smokeless Tobacco Never Used   Social History   Substance and Sexual Activity  Alcohol Use Yes   Comment: Rare   Depression screen PHQ 2/9 12/26/2017  Decreased Interest 0  Down, Depressed, Hopeless 0  PHQ - 2 Score 0  Altered sleeping 0  Tired, decreased energy 0  Change in appetite 0  Feeling bad or failure about yourself  0  Trouble concentrating 0  Moving slowly or fidgety/restless 0  Suicidal thoughts 0  PHQ-9 Score 0    Domestic violence concerns - no  HTN SCREENING - SEE VITALS  SEXUAL HEALTH  Sexually active in the past year - Yes with female.  Need/want STI testing today? - yes  Concerns about libido or pain with sex? - no  Plans for pregnancy? - none  INFECTIOUS DISEASE SCREENING  HIV - needs  GC/CT - needs  HepC - DOB 1945-1965 - does not need  TB - does not need  DISEASE SCREENING  Lipid - needs  DM2 - needs  Osteoporosis - women age 87+ - does not need  CANCER SCREENING  Cervical - needs  Breast - needs  Lung - does not need  Colon - does not need  ADULT VACCINATION  Influenza - annual vaccine recommended  Td - booster every 10 years   Zoster - Shingrix recommended 50+  PCV13 - was not indicated  PPSV23 - was not indicated Immunization History  Administered Date(s) Administered  . Influenza-Unspecified 08/03/2017    OTHER  Fall - exercise and Vit D age 87+ - does not need     Visit summary with medication list and pertinent instructions was printed for patient to review. All questions at time of visit were answered - patient instructed to contact office with any additional concerns. ER/RTC precautions were reviewed  with the patient.   Follow-up plan: Return for PAP at your convenience. Annual check-up i none year as long as labs are ok now .    Please note: voice recognition software was used to produce this document, and typos may escape review. Please contact Dr. Lyn Hollingshead for any needed clarifications.

## 2017-12-27 LAB — COMPLETE METABOLIC PANEL WITH GFR
AG RATIO: 1.7 (calc) (ref 1.0–2.5)
ALT: 7 U/L (ref 6–29)
AST: 12 U/L (ref 10–30)
Albumin: 4.1 g/dL (ref 3.6–5.1)
Alkaline phosphatase (APISO): 83 U/L (ref 33–115)
BUN: 9 mg/dL (ref 7–25)
CALCIUM: 9.3 mg/dL (ref 8.6–10.2)
CO2: 28 mmol/L (ref 20–32)
CREATININE: 0.85 mg/dL (ref 0.50–1.10)
Chloride: 109 mmol/L (ref 98–110)
GFR, EST NON AFRICAN AMERICAN: 85 mL/min/{1.73_m2} (ref >=60)
GFR, Est African American: 99 mL/min/{1.73_m2} (ref >=60)
Globulin: 2.4 g/dL (calc) (ref 1.9–3.7)
Glucose, Bld: 97 mg/dL (ref 65–99)
POTASSIUM: 4.2 mmol/L (ref 3.5–5.3)
Sodium: 143 mmol/L (ref 135–146)
TOTAL PROTEIN: 6.5 g/dL (ref 6.1–8.1)
Total Bilirubin: 0.6 mg/dL (ref 0.2–1.2)

## 2017-12-27 LAB — CBC
HCT: 38.9 % (ref 35.0–45.0)
Hemoglobin: 13.4 g/dL (ref 11.7–15.5)
MCH: 31.8 pg (ref 27.0–33.0)
MCHC: 34.4 g/dL (ref 32.0–36.0)
MCV: 92.2 fL (ref 80.0–100.0)
MPV: 11.2 fL (ref 7.5–12.5)
PLATELETS: 256 10*3/uL (ref 140–400)
RBC: 4.22 10*6/uL (ref 3.80–5.10)
RDW: 11.7 % (ref 11.0–15.0)
WBC: 6.1 10*3/uL (ref 3.8–10.8)

## 2017-12-27 LAB — LIPID PANEL
Cholesterol: 160 mg/dL (ref <200)
HDL: 37 mg/dL — AB (ref >50)
LDL CHOLESTEROL (CALC): 99 mg/dL
NON-HDL CHOLESTEROL (CALC): 123 mg/dL (ref <130)
Total CHOL/HDL Ratio: 4.3 (calc) (ref <5.0)
Triglycerides: 137 mg/dL (ref <150)

## 2017-12-27 LAB — RPR: RPR: NONREACTIVE

## 2017-12-27 LAB — HIV ANTIBODY (ROUTINE TESTING W REFLEX): HIV 1&2 Ab, 4th Generation: NONREACTIVE

## 2017-12-27 LAB — C. TRACHOMATIS/N. GONORRHOEAE RNA
C. TRACHOMATIS RNA, TMA: NOT DETECTED
N. gonorrhoeae RNA, TMA: NOT DETECTED

## 2017-12-27 LAB — HEPATITIS C ANTIBODY
Hepatitis C Ab: NONREACTIVE
SIGNAL TO CUT-OFF: 0 (ref <1.00)

## 2017-12-27 LAB — HEPATITIS B SURFACE ANTIBODY,QUALITATIVE: HEP B S AB: NONREACTIVE

## 2017-12-27 LAB — VITAMIN D 25 HYDROXY (VIT D DEFICIENCY, FRACTURES): VIT D 25 HYDROXY: 42 ng/mL (ref 30–100)

## 2017-12-27 LAB — HEPATITIS B CORE ANTIBODY, TOTAL: HEP B C TOTAL AB: NONREACTIVE

## 2017-12-27 LAB — TSH: TSH: 1.17 m[IU]/L

## 2018-01-16 ENCOUNTER — Ambulatory Visit (INDEPENDENT_AMBULATORY_CARE_PROVIDER_SITE_OTHER): Payer: Managed Care, Other (non HMO)

## 2018-01-16 DIAGNOSIS — Z1231 Encounter for screening mammogram for malignant neoplasm of breast: Secondary | ICD-10-CM | POA: Diagnosis not present

## 2018-01-16 NOTE — Progress Notes (Signed)
Pt has seen results on MyChart and message also sent for patient to call back if any questions.

## 2018-02-04 ENCOUNTER — Encounter: Payer: Self-pay | Admitting: Osteopathic Medicine

## 2018-02-04 ENCOUNTER — Other Ambulatory Visit (HOSPITAL_COMMUNITY)
Admission: RE | Admit: 2018-02-04 | Discharge: 2018-02-04 | Disposition: A | Discharge status: Home / Self Care | Payer: Managed Care, Other (non HMO) | Source: Ambulatory Visit | Attending: Osteopathic Medicine | Admitting: Osteopathic Medicine

## 2018-02-04 ENCOUNTER — Ambulatory Visit (INDEPENDENT_AMBULATORY_CARE_PROVIDER_SITE_OTHER): Payer: Managed Care, Other (non HMO) | Admitting: Osteopathic Medicine

## 2018-02-04 VITALS — BP 133/75 | HR 74 | Temp 98.5°F | Wt 195.1 lb

## 2018-02-04 DIAGNOSIS — E669 Obesity, unspecified: Secondary | ICD-10-CM | POA: Insufficient documentation

## 2018-02-04 DIAGNOSIS — Z124 Encounter for screening for malignant neoplasm of cervix: Secondary | ICD-10-CM | POA: Diagnosis not present

## 2018-02-04 DIAGNOSIS — Z6831 Body mass index (BMI) 31.0-31.9, adult: Secondary | ICD-10-CM | POA: Diagnosis not present

## 2018-02-04 DIAGNOSIS — N926 Irregular menstruation, unspecified: Secondary | ICD-10-CM

## 2018-02-04 MED ORDER — NORGESTIMATE-ETH ESTRADIOL 0.25-35 MG-MCG PO TABS: 1.0000 | ORAL_TABLET | Freq: Every day | ORAL | 3 refills | Status: DC

## 2018-02-04 NOTE — Progress Notes (Signed)
HPI: Alison Lucas is a 42 y.o. female who  has a past medical history of Kidney stones.  [facility] today, 02/04/18,  for chief complaint of:  Cervical cancer screening Discuss diet pills   Due for Pap smear, declines additional STD testing.   Obesity: Would like to discuss getting on a diet pill. She is not currently on any exercise program, not wanting calories/foods. She has been on several fad diets in the past, most recently on by mouth diet. Tends to stop these after about a month and then gains all the weight back.  Irregular periods, appeared lasting for about 10 days at a time. PMS symptoms are bothering her as well. Would like to get on birth control.    Past medical history, surgical history, social history and family history reviewed. No updates needed.   Current medication list and allergy/intolerance information reviewed.    No current outpatient medications on file prior to visit.   No current facility-administered medications on file prior to visit.    Allergies  Allergen Reactions  . Codeine Anaphylaxis      Review of Systems:  Constitutional: No recent illness  HEENT: No  headache, no vision change  Cardiac: No  chest pain, No  pressure, No palpitations  Respiratory:  No  shortness of breath. No  Cough  Gastrointestinal: No  abdominal pain, no change on bowel habits  Musculoskeletal: No new myalgia/arthralgia  Skin: No  Rash  Hem/Onc: No  easy bruising/bleeding, No  abnormal lumps/bumps  Neurologic: No  weakness, No  Dizziness  Psychiatric: No  concerns with depression, No  concerns with anxiety  Exam:  BP 133/75   Pulse 74   Temp 98.5 F (36.9 C) (Oral)   Wt 195 lb 1.3 oz (88.5 kg)   BMI 31.49 kg/m   Constitutional: VS see above. General Appearance: alert, well-developed, well-nourished, NAD  Eyes: Normal lids and conjunctive, non-icteric sclera  Ears, Nose, Mouth, Throat: MMM,  Normal external inspection ears/nares/mouth/lips/gums.  Neck: No masses, trachea midline.   Respiratory: Normal respiratory effort. no wheeze, no rhonchi, no rales  Cardiovascular: S1/S2 normal, no murmur, no rub/gallop auscultated. RRR.   Musculoskeletal: Gait normal. Symmetric and independent movement of all extremities  Neurological: Normal balance/coordination. No tremor.  Skin: warm, dry, intact.   Psychiatric: Normal judgment/insight. Normal mood and affect. Oriented x3.  GYN: No lesions/ulcers to external genitalia, normal urethra, normal vaginal mucosa, physiologic discharge, cervix normal without lesions, uterus not enlarged or tender, adnexa no masses and nontender      ASSESSMENT/PLAN:   Cervical cancer screening - Plan: Cytology - PAP  Adult BMI 31.0-31.9 kg/sq m - See patient printed instructions.  Irregular periods/menstrual cycles - Possibly perimenopausal. Discussed OCP as option to help regulate.     Follow-up plan: [facility] about 1 year (around 02[facility]05/2019[facility] If still want to discuss weight loss Rx see me for weight check 4-6 wk.  Visit summary with medication list and pertinent instructions was printed for patient to review, alert us if any changes needed. All questions at time of visit were answered - patient instructed to contact office with any additional concerns. ER/RTC precautions were reviewed with the patient and understanding verbalized.   Note: Total time spent on obesity and irregular periods problem-based dicsussion 15 minutes, greater than 50% of the visit was spent face-to-face counseling and coordinating care   Please note: voice recognition software was used to produce  this document, and typos may escape review. Please contact Dr. Sheppard Coil for any needed clarifications.

## 2018-02-04 NOTE — Patient Instructions (Addendum)
Weight loss: important things to remember  It is hard work! You will have setbacks, but don't get discouraged. The goal is not short-term success, it is long-term health.   Looking at the numbers is important to track your progress and set goals, but how you are feeling and your overall health are the most important things! BMI and pounds and calories and miles logged aren't everything - they are tools to help us reach your goals.  You can do this!!!   Things to remember for exercise for weight loss:   Please note - I am not a certified personal trainer. I can present you with ideas and general workout goals, but an exercise program is largely up to you. Find something you can stick with, and something you enjoy!   As you progress in your exercise regimen think about gradually increasing the following, week by week:   intensity (how strenuous is your workout)  frequency (how often you are exercising)  duration (how many minutes at a time you are exercising)  Walking for 20 minutes a day is certainly better than nothing, but more strenuous exercise will develop better cardiovascular fitness.   interval training (high-intensity alternating with low-intensity, think walk/jog rather than just walk)  muscle strengthening exercises (weight lifting, calisthenics, yoga) - this also helps prevent osteoporosis!   Things to remember for diet changes for weight loss:   Please note - I am not a certified dietician. I can present you with ideas and general diet goals, but a meal plan is largely up to you. I am happy to refer you to a dietician who can give you a detailed meal plan.  Apps/logs are crucial to track how you're eating! It's not realistic to be logging everything you eat forever, but when you're starting a healthy eating lifestyle it's very helpful, and checking in with logs now and then helps you stick to your program!   Calorie restriction with the goal weight loss of no more than one  to one and a half pounds per week.   Increase lean protein such as chicken, fish, [place].   Decrease fatty foods such as dairy, butter.   Decrease sugary foods. Avoid sugary drinks such as soda or juice.  Increase fiber found in fruit and vegetables.   Medications approved for long-term use for obesity  Qsymia (Phentermine and Topiramate)  Saxenda (Liraglutide 3 mg/day)  Contrave (Bupropion and Naltrexone)  Lorcaserin (Belviq or Belviq XR) I recommend that you research the above medications and see which one(s) your insurance may or may not cover or which coupons you may qualify for (check out the web-sites for the individual drugs): If you call your insurance, ask them specifically what medications are on their formulary that are approved for obesity treatment. They should be able to send you a list or tell you over the phone. Remember, medications aren't magic! You MUST be diligent about lifestyle changes as well!

## 2018-02-07 LAB — CYTOLOGY - PAP
DIAGNOSIS: NEGATIVE
HPV (WINDOPATH): NOT DETECTED

## 2018-12-31 ENCOUNTER — Other Ambulatory Visit: Payer: Self-pay | Admitting: Osteopathic Medicine

## 2019-03-20 ENCOUNTER — Other Ambulatory Visit: Payer: Self-pay | Admitting: Osteopathic Medicine

## 2019-03-20 NOTE — Telephone Encounter (Signed)
Please review for refill- patient at [facility] 

## 2019-07-04 DIAGNOSIS — U071 COVID-19: Secondary | ICD-10-CM

## 2019-07-04 HISTORY — DX: COVID-19: U07.1

## 2020-10-24 ENCOUNTER — Emergency Department
Admission: EM | Admit: 2020-10-24 | Discharge: 2020-10-24 | Disposition: A | Discharge status: Home / Self Care | Payer: Managed Care, Other (non HMO) | Source: Home / Self Care

## 2020-10-24 ENCOUNTER — Emergency Department (INDEPENDENT_AMBULATORY_CARE_PROVIDER_SITE_OTHER): Payer: Managed Care, Other (non HMO)

## 2020-10-24 ENCOUNTER — Other Ambulatory Visit: Payer: Self-pay

## 2020-10-24 DIAGNOSIS — R1084 Generalized abdominal pain: Secondary | ICD-10-CM | POA: Diagnosis not present

## 2020-10-24 DIAGNOSIS — N200 Calculus of kidney: Secondary | ICD-10-CM | POA: Diagnosis not present

## 2020-10-24 DIAGNOSIS — R110 Nausea: Secondary | ICD-10-CM | POA: Diagnosis not present

## 2020-10-24 LAB — POCT URINE PREGNANCY: Preg Test, Ur: NEGATIVE

## 2020-10-24 LAB — POCT URINALYSIS DIP (MANUAL ENTRY)
Bilirubin, UA: NEGATIVE
Glucose, UA: NEGATIVE mg/dL
Ketones, POC UA: NEGATIVE mg/dL
Leukocytes, UA: NEGATIVE
Nitrite, UA: NEGATIVE
Protein Ur, POC: NEGATIVE mg/dL
Spec Grav, UA: 1.025 (ref 1.010–1.025)
Urobilinogen, UA: 0.2 E.U./dL
pH, UA: 5.5 (ref 5.0–8.0)

## 2020-10-24 MED ORDER — KETOROLAC TROMETHAMINE 10 MG PO TABS
10.0000 mg | ORAL_TABLET | Freq: Four times a day (QID) | ORAL | 0 refills | Status: DC | PRN
Start: 2020-10-24 — End: 2021-01-30

## 2020-10-24 MED ORDER — ONDANSETRON HCL 4 MG/2ML IJ SOLN
2.0000 mg | Freq: Once | INTRAMUSCULAR | Status: AC
  Administered 2020-10-24: 2 mg via INTRAVENOUS

## 2020-10-24 MED ORDER — ONDANSETRON HCL 4 MG PO TABS: 4.0000 mg | ORAL_TABLET | Freq: Four times a day (QID) | ORAL | 0 refills | Status: DC

## 2020-10-24 MED ORDER — SODIUM CHLORIDE 0.9 % IV SOLN: Freq: Once | INTRAVENOUS | Status: AC

## 2020-10-24 MED ORDER — KETOROLAC TROMETHAMINE 60 MG/2ML IM SOLN
60.0000 mg | Freq: Once | INTRAMUSCULAR | Status: AC
  Administered 2020-10-24: 60 mg via INTRAMUSCULAR

## 2020-10-24 NOTE — ED Triage Notes (Signed)
Pt c/o RT flank pain since Saturday. Hx of kidney stones 12 yrs ago. Needed surgery. Pain worsening over last 12 hours. Started throwing up at work today due to pain. Pain 6/10

## 2020-10-24 NOTE — ED Provider Notes (Signed)
[facility]    CSN: [id number] Arrival date & time: 10/24/20  0901      History   Chief Complaint Chief Complaint  Patient presents with  . Flank Pain    RT    HPI Alison Lucas is a 44 y.o. female.   HPI  Patient presents with right flank pain.  Patient has a history of left renal lithiasis which required surgical intervention.  Current symptoms have been present for Present for a total of 3 days.  Patient attempted to go to work and had such severe right flank pain and nausea she decided to come in for evaluation today.  Previous history of left renal lithiasis which required surgical intervention to remove the stone in 2009. Past Medical History:  Diagnosis Date  . Kidney stones     Patient Active Problem List   Diagnosis Date Noted  . History of kidney stones 12/26/2017    Past Surgical History:  Procedure Laterality Date  . KIDNEY STONE SURGERY  05/2008    OB History    Gravida  1   Para  1   Term      Preterm      AB      Living  1     SAB      TAB      Ectopic      Multiple      Live Births               Home Medications    Prior to Admission medications   Medication Sig Start Date End Date Taking? Authorizing Provider  norgestimate-ethinyl estradiol (ESTARYLLA) 0.25-35 MG-MCG tablet Take 1 tablet by mouth daily. Due for appt before RX runs out 12/31/18   Sunnie Nielsen, DO    Family History Family History  Problem Relation Age of Onset  . Hypertension Mother   . Diabetes Mother   . High blood pressure Mother   . Hypertension Sister   . Heart attack Maternal Grandfather   . High blood pressure Maternal Grandfather   . Bone cancer Paternal Grandfather     Social History Social History   Tobacco Use  . Smoking status: Never Smoker  . Smokeless tobacco: Never Used  Vaping Use  . Vaping Use: Never used  Substance Use Topics  . Alcohol use: Yes    Comment: Rare  . Drug use: No     Allergies     Codeine Review of Systems Review of Systems Pertinent negatives listed in HPI  Physical Exam Triage Vital Signs ED Triage Vitals  Enc Vitals Group     BP 10/24/20 0921 (!) 154/90     Pulse Rate 10/24/20 0921 80     Resp 10/24/20 0921 18     Temp 10/24/20 0921 98.4 F (36.9 C)     Temp Source 10/24/20 0921 Oral     SpO2 10/24/20 0921 98 %     Weight --      Height --      Head Circumference --      Peak Flow --      Pain Score 10/24/20 0922 6     Pain Loc --      Pain Edu? --      Excl. in GC? --    No data found.  Updated Vital Signs BP (!) 154/90 (BP Location: Left Arm)   Pulse 80   Temp 98.4 F (36.9 C) (Oral)   Resp 18  LMP 10/12/2020 (Approximate)   SpO2 98%   Visual Acuity Right Eye Distance:   Left Eye Distance:   Bilateral Distance:    Right Eye Near:   Left Eye Near:    Bilateral Near:     Physical Exam General appearance: alert, well developed, well nourished, cooperative and in no distress Head: Normocephalic, without obvious abnormality, atraumatic Respiratory: Respirations even and unlabored, normal respiratory rate Heart: rate and rhythm normal. No gallop or murmurs noted on exam  Abdomen: BS +, no distention, no rebound tenderness, or Right Flank Pain Extremities: No gross deformities Skin: Skin color, texture, turgor normal. No rashes seen  Psych: Appropriate mood and affect.  UC Treatments / Results  Labs (all labs ordered are listed, but only abnormal results are displayed) Labs Reviewed  POCT URINALYSIS DIP (MANUAL ENTRY) - Abnormal; Notable for the following components:      Result Value   Blood, UA moderate (*)    All other components within normal limits  POCT URINE PREGNANCY    EKG   Radiology No results found.  Procedures Procedures (including critical care time)  Medications Ordered in UC Medications - No data to display  Initial Impression / Assessment and Plan / UC Course  I have reviewed the triage vital  signs and the nursing notes.  Pertinent labs & imaging results that were available during my care of the patient were reviewed by me and considered in my medical decision making (see chart for details).    Right renal renal stone confirmed by KUB approximately 5 mm, patient treated with Toradol 60 mg IV and nausea managed with Zofran 4 mg IV and received a bolus of IV fluids.  Patient is scheduled to follow-up this week with [facility].  Hat given to collect urine in the event that she is able to pass the renal stone.  Patient will go to ER if symptoms worsen or do not improve. Final Clinical Impressions(s) / UC Diagnoses   Final diagnoses:  Right renal stone     Discharge Instructions     Follow-up with [facility] for further work-up and evaluation of kidney stone.     ED Prescriptions    Medication Sig Dispense Auth. Provider   ondansetron (ZOFRAN) 4 MG tablet  (Status: Discontinued) Take 1 tablet (4 mg total) by mouth every 6 (six) hours. 12 tablet Bing Neighbors, FNP   ondansetron (ZOFRAN) 4 MG tablet Take 1 tablet (4 mg total) by mouth every 6 (six) hours. 12 tablet Bing Neighbors, FNP   ketorolac (TORADOL) 10 MG tablet Take 1 tablet (10 mg total) by mouth every 6 (six) hours as needed. 20 tablet Bing Neighbors, FNP     PDMP not reviewed this encounter.   Bing Neighbors, FNP 10/24/20 [phone]

## 2020-10-24 NOTE — Discharge Instructions (Addendum)
Follow-up with [facility] for further work-up and evaluation of kidney stone.

## 2020-10-25 ENCOUNTER — Other Ambulatory Visit: Payer: Self-pay

## 2020-10-25 ENCOUNTER — Encounter (HOSPITAL_BASED_OUTPATIENT_CLINIC_OR_DEPARTMENT_OTHER): Payer: Self-pay | Admitting: Urology

## 2020-10-25 ENCOUNTER — Other Ambulatory Visit: Payer: Self-pay | Admitting: Urology

## 2020-10-25 LAB — BASIC METABOLIC PANEL
BUN: 5 (ref 4–21)
CO2: 29 — AB (ref 13–22)
Chloride: 110 — AB (ref 99–108)
Creatinine: 0.4 — AB (ref 0.5–1.1)
Glucose: 98
Potassium: 4.5 (ref 3.4–5.3)
Sodium: 140 (ref 137–147)

## 2020-10-25 LAB — COMPREHENSIVE METABOLIC PANEL: Calcium: 9.2 (ref 8.7–10.7)

## 2020-10-25 NOTE — Progress Notes (Signed)
Spoke w/ via phone for pre-op interview---pt Lab needs dos---- urine poct              Lab results-----none- COVID test ------10-29-2020 at 1015 am Arrive at -------800 am 11-02-2020 NPO after MN NO Solid Food.  Clear liquids from MN until---700 am then npo Medications to take morning of surgery -----none Diabetic medication -----n/a Patient Special Instructions -----none Pre-Op special Istructions -----none Patient verbalized understanding of instructions that were given at this phone interview. Patient denies shortness of breath, chest pain, fever, cough at this phone interview.

## 2020-10-26 NOTE — Progress Notes (Signed)
Spoke with pt by phone , pt to call connie she passed stone on own

## 2020-10-29 ENCOUNTER — Other Ambulatory Visit (HOSPITAL_COMMUNITY): Payer: Managed Care, Other (non HMO)

## 2020-11-02 ENCOUNTER — Ambulatory Visit (HOSPITAL_BASED_OUTPATIENT_CLINIC_OR_DEPARTMENT_OTHER): Admission: RE | Admit: 2020-11-02 | Payer: Managed Care, Other (non HMO) | Source: Home / Self Care | Admitting: Urology

## 2020-11-02 HISTORY — DX: Personal history of urinary calculi: Z87.442

## 2020-11-02 SURGERY — CYSTOSCOPY/URETEROSCOPY/HOLMIUM LASER/STENT PLACEMENT
Anesthesia: General | Laterality: Right

## 2021-01-30 ENCOUNTER — Other Ambulatory Visit: Payer: Self-pay

## 2021-01-30 ENCOUNTER — Encounter: Payer: Self-pay | Admitting: Osteopathic Medicine

## 2021-01-30 ENCOUNTER — Ambulatory Visit (INDEPENDENT_AMBULATORY_CARE_PROVIDER_SITE_OTHER): Payer: Managed Care, Other (non HMO) | Admitting: Osteopathic Medicine

## 2021-01-30 VITALS — BP 135/84 | HR 86 | Temp 98.4°F | Wt 200.0 lb

## 2021-01-30 DIAGNOSIS — Z1211 Encounter for screening for malignant neoplasm of colon: Secondary | ICD-10-CM | POA: Diagnosis not present

## 2021-01-30 DIAGNOSIS — Z1231 Encounter for screening mammogram for malignant neoplasm of breast: Secondary | ICD-10-CM

## 2021-01-30 DIAGNOSIS — Z800 Family history of malignant neoplasm of digestive organs: Secondary | ICD-10-CM

## 2021-01-30 DIAGNOSIS — I10 Essential (primary) hypertension: Secondary | ICD-10-CM

## 2021-01-30 DIAGNOSIS — Z87442 Personal history of urinary calculi: Secondary | ICD-10-CM | POA: Diagnosis not present

## 2021-01-30 DIAGNOSIS — Z0000 Encounter for general adult medical examination without abnormal findings: Secondary | ICD-10-CM | POA: Diagnosis not present

## 2021-01-30 DIAGNOSIS — F321 Major depressive disorder, single episode, moderate: Secondary | ICD-10-CM | POA: Diagnosis not present

## 2021-01-30 LAB — COMPLETE METABOLIC PANEL WITH GFR
AG Ratio: 1.8 (calc) (ref 1.0–2.5)
ALT: 14 U/L (ref 6–29)
AST: 15 U/L (ref 10–30)
Albumin: 4.3 g/dL (ref 3.6–5.1)
Alkaline phosphatase (APISO): 76 U/L (ref 31–125)
BUN: 9 mg/dL (ref 7–25)
CO2: 27 mmol/L (ref 20–32)
Calcium: 9.4 mg/dL (ref 8.6–10.2)
Chloride: 104 mmol/L (ref 98–110)
Creat: 0.71 mg/dL (ref 0.50–1.10)
GFR, Est African American: 120 mL/min/{1.73_m2} (ref >=60)
GFR, Est Non African American: 104 mL/min/{1.73_m2} (ref >=60)
Globulin: 2.4 g/dL (calc) (ref 1.9–3.7)
Glucose, Bld: 92 mg/dL (ref 65–139)
Potassium: 4.4 mmol/L (ref 3.5–5.3)
Sodium: 140 mmol/L (ref 135–146)
Total Bilirubin: 1.5 mg/dL — ABNORMAL HIGH (ref 0.2–1.2)
Total Protein: 6.7 g/dL (ref 6.1–8.1)

## 2021-01-30 LAB — CBC
HCT: 45.2 % — ABNORMAL HIGH (ref 35.0–45.0)
Hemoglobin: 15.3 g/dL (ref 11.7–15.5)
MCH: 32.9 pg (ref 27.0–33.0)
MCHC: 33.8 g/dL (ref 32.0–36.0)
MCV: 97.2 fL (ref 80.0–100.0)
MPV: 11.3 fL (ref 7.5–12.5)
Platelets: 242 10*3/uL (ref 140–400)
RBC: 4.65 10*6/uL (ref 3.80–5.10)
RDW: 12.4 % (ref 11.0–15.0)
WBC: 7.6 10*3/uL (ref 3.8–10.8)

## 2021-01-30 MED ORDER — OLMESARTAN MEDOXOMIL 5 MG PO TABS: 5.0000 mg | ORAL_TABLET | Freq: Every day | ORAL | 1 refills | Status: DC

## 2021-01-30 MED ORDER — ESCITALOPRAM OXALATE 10 MG PO TABS: 10.0000 mg | ORAL_TABLET | Freq: Every day | ORAL | 1 refills | Status: DC

## 2021-01-30 NOTE — Progress Notes (Signed)
Alison Lucas is a 45 y.o. female who presents to  [facility]  today, 01/30/21, seeking care for the following:  . ANNUAL PHYSICAL - see below . MENTAL HEALTH - concerned for increased depressive symptoms recently d/t family issues . HIGH BP - pt has noted BP at home borderline around 140s/90s, mom has hx HTN and is on multiple med, pt is concerned, weight loss has not made much difference in BP, would like to get on Rx      ASSESSMENT & PLAN with other pertinent findings:  The primary encounter diagnosis was Annual physical exam. Diagnoses of Breast cancer screening by mammogram, Colon cancer screening, History of kidney stones, Essential hypertension, and Current moderate episode of major depressive disorder without prior episode [facility]) were also pertinent to this visit.   1. Annual physical exam SEE BELOW  2. Breast cancer screening by mammogram mammo ordered  3. Colon cancer screening, (+)FH colon cancer inf ather dx in his 37s Referral to GI for colonoscopy  4. History of kidney stones Stable, refer for urology prn   5. Essential hypertension Starting low dose ARB in a few weeks, advised starting lexapro first   6. Current moderate episode of major depressive disorder without prior episode [facility]) Starting lexapro   Patient Instructions  General Preventive Care  Most recent routine screening labs: ordered today.   Blood pressure goal 140/90 or less.   Tobacco: don't!   Alcohol: responsible moderation is ok for most adults - if you have concerns about your alcohol intake, please talk to me!   Exercise: as tolerated to reduce risk of cardiovascular disease and diabetes. Strength training will also prevent osteoporosis.   Mental health: if need for mental health care (medicines, counseling, other), or concerns about moods, please let me know!   Sexual / Reproductive health: if need for STD testing, or if  concerns with libido/pain problems, please let me know! If you need to discuss family planning, please let me know!   Advanced Directive: Living Will and/or Healthcare Power of Attorney recommended for all adults, regardless of age or health.  Vaccines  Flu vaccine: for almost everyone, every fall.   Shingles vaccine: after age 74.   Pneumonia vaccines: after age 28.  Tetanus booster: every 10 years / 3rd trimester of pregnancy   COVID vaccine: THANKS for getting your vaccines! :)  Cancer screenings   Colon cancer screening: for everyone age 79-75.   Breast cancer screening: mammogram at age 61 every other year at least, and annually after age 43.   Cervical cancer screening: Pap every 5 years if normal: due 02/2023  Lung cancer screening: not needed for nonsmokers  Infection screenings  . HIV: recommended screening at least once age 67-65, more often as needed. . Gonorrhea/Chlamydia: screening as needed . Hepatitis C: recommended once for everyone age 6-75 . TB: certain at-risk populations, or depending on work requirements and/or travel history Other . Bone Density Test: recommended for women at age 14   Orders Placed This Encounter  Procedures  . CBC  . COMPLETE METABOLIC PANEL WITH GFR  . Lipid panel    Meds ordered this encounter  Medications  . olmesartan (BENICAR) 5 MG tablet    Sig: Take 1 tablet (5 mg total) by mouth at bedtime.    Dispense:  90 tablet    Refill:  1  . escitalopram (LEXAPRO) 10 MG tablet    Sig: Take 1 tablet (  10 mg total) by mouth at bedtime.    Dispense:  90 tablet    Refill:  1     See below for relevant physical exam findings  See below for recent lab and imaging results reviewed  Medications, allergies, PMH, PSH, SocH, FamH reviewed below    Follow-up instructions: Return in about 1 year (around 01/30/2022) for ANNUAL CHECK-UP - SEE [place] SOONER IF  NEEDED.                                        Exam:  BP 135/84 (BP Location: Left Arm, Patient Position: Sitting, Cuff Size: Large)   Pulse 86   Temp 98.4 F (36.9 C) (Oral)   Wt 200 lb (90.7 kg)   BMI 33.28 kg/m   Constitutional: VS see above. General Appearance: alert, well-developed, well-nourished, NAD  Neck: No masses, trachea midline.   Respiratory: Normal respiratory effort. no wheeze, no rhonchi, no rales  Cardiovascular: S1/S2 normal, no murmur, no rub/gallop auscultated. RRR.   Musculoskeletal: Gait normal. Symmetric and independent movement of all extremities  Abdominal: non-tender, non-distended, no appreciable organomegaly, neg Murphy's, BS WNLx4  Neurological: Normal balance/coordination. No tremor.  Skin: warm, dry, intact.   Psychiatric: Normal judgment/insight. Normal mood and affect. Oriented x3.   Current Meds  Medication Sig  . escitalopram (LEXAPRO) 10 MG tablet Take 1 tablet (10 mg total) by mouth at bedtime.  Marland Kitchen olmesartan (BENICAR) 5 MG tablet Take 1 tablet (5 mg total) by mouth at bedtime.    Allergies  Allergen Reactions  . Codeine Anaphylaxis    Patient Active Problem List   Diagnosis Date Noted  . History of kidney stones 12/26/2017    Family History  Problem Relation Age of Onset  . Hypertension Mother   . Diabetes Mother   . High blood pressure Mother   . Hypertension Sister   . Heart attack Maternal Grandfather   . High blood pressure Maternal Grandfather   . Bone cancer Paternal Grandfather     Social History   Tobacco Use  Smoking Status Never Smoker  Smokeless Tobacco Never Used    Past Surgical History:  Procedure Laterality Date  . KIDNEY STONE SURGERY  05/2008    Immunization History  Administered Date(s) Administered  . Influenza-Unspecified 08/03/2017  . PFIZER(Purple Top)SARS-COV-2 Vaccination 12/30/2019, 01/20/2020    No results found for this or any previous visit  (from the past 2160 hour(s)).  No results found.     All questions at time of visit were answered - patient instructed to contact office with any additional concerns or updates. ER/RTC precautions were reviewed with the patient as applicable.   Please note: manual typing as well as voice recognition software may have been used to produce this document - typos may escape review. Please contact Dr. Lyn Hollingshead for any needed clarifications.   Total encounter time on date of service, 01/30/21, for non-preventive care / problem-based components (HTN, MENTAL HEALTH / DEPRESSION) was 30 minutes spent addressing problems/issues as noted, including time spent in discussion with patient regarding the HPI, ROS, confirming history, reviewing Assessment & Plan, as well as time spent on coordination of care, record review.

## 2021-01-30 NOTE — Patient Instructions (Addendum)
General Preventive Care  Most recent routine screening labs: ordered today.   Blood pressure goal 140/90 or less.   Tobacco: don't!   Alcohol: responsible moderation is ok for most adults - if you have concerns about your alcohol intake, please talk to me!   Exercise: as tolerated to reduce risk of cardiovascular disease and diabetes. Strength training will also prevent osteoporosis.   Mental health: if need for mental health care (medicines, counseling, other), or concerns about moods, please let me know!   Sexual / Reproductive health: if need for STD testing, or if concerns with libido/pain problems, please let me know! If you need to discuss family planning, please let me know!   Advanced Directive: Living Will and/or Healthcare Power of Attorney recommended for all adults, regardless of age or health.  Vaccines  Flu vaccine: for almost everyone, every fall.   Shingles vaccine: after age 45.   Pneumonia vaccines: after age 45.  Tetanus booster: every 10 years / 3rd trimester of pregnancy   COVID vaccine: THANKS for getting your vaccines! :)  Cancer screenings   Colon cancer screening: for everyone age 45-75.   Breast cancer screening: mammogram at age 45 every other year at least, and annually after age 45.   Cervical cancer screening: Pap every 5 years if normal: due 02/2023  Lung cancer screening: not needed for nonsmokers  Infection screenings  . HIV: recommended screening at least once age 45-65, more often as needed. . Gonorrhea/Chlamydia: screening as needed . Hepatitis C: recommended once for everyone age 45-75 . TB: certain at-risk populations, or depending on work requirements and/or travel history Other . Bone Density Test: recommended for women at age 2

## 2021-01-31 ENCOUNTER — Encounter: Payer: Self-pay | Admitting: Osteopathic Medicine

## 2021-01-31 DIAGNOSIS — Z6833 Body mass index (BMI) 33.0-33.9, adult: Secondary | ICD-10-CM

## 2021-02-09 ENCOUNTER — Ambulatory Visit (INDEPENDENT_AMBULATORY_CARE_PROVIDER_SITE_OTHER): Payer: Managed Care, Other (non HMO)

## 2021-02-09 ENCOUNTER — Other Ambulatory Visit: Payer: Self-pay

## 2021-02-09 DIAGNOSIS — Z1231 Encounter for screening mammogram for malignant neoplasm of breast: Secondary | ICD-10-CM

## 2021-02-21 ENCOUNTER — Encounter: Payer: Self-pay | Admitting: Osteopathic Medicine

## 2021-02-27 ENCOUNTER — Other Ambulatory Visit: Payer: Self-pay

## 2021-02-27 ENCOUNTER — Ambulatory Visit: Payer: Managed Care, Other (non HMO) | Admitting: Osteopathic Medicine

## 2021-02-27 ENCOUNTER — Encounter: Payer: Self-pay | Admitting: Osteopathic Medicine

## 2021-02-27 VITALS — BP 152/78 | HR 70 | Temp 98.1°F | Wt 198.1 lb

## 2021-02-27 DIAGNOSIS — I10 Essential (primary) hypertension: Secondary | ICD-10-CM

## 2021-02-27 DIAGNOSIS — F321 Major depressive disorder, single episode, moderate: Secondary | ICD-10-CM | POA: Diagnosis not present

## 2021-02-27 MED ORDER — OLMESARTAN MEDOXOMIL 20 MG PO TABS
10.0000 mg | ORAL_TABLET | Freq: Every day | ORAL | 1 refills | Status: DC
Start: 2021-02-27 — End: 2021-04-19

## 2021-02-27 MED ORDER — ESCITALOPRAM OXALATE 20 MG PO TABS: 20.0000 mg | ORAL_TABLET | Freq: Every day | ORAL | 1 refills | Status: DC

## 2021-02-27 NOTE — Progress Notes (Signed)
Alison Lucas is a 45 y.o. female who presents to  [facility] at [facility]  today, 02/27/21, seeking care for the following:  Marland Kitchen Mental health: last visit stared Lexapro 10 mg d/t MDD/anxiety. No side effects but not really any benefit either   . BP: last visit we started olmesartan 5 mg for borderline BP and pt concern for (+)FH. Home BP SBP 150s-160s, occaisonal 130s  BP Readings from Last 3 Encounters:  02/27/21 (!) 152/78  01/30/21 135/84  10/24/20 (!) 154/90   Depression screen PHQ 2/9 02/27/2021 01/30/2021 02/04/2018  Decreased Interest 1 1 0  Down, Depressed, Hopeless 1 1 0  PHQ - 2 Score 2 2 0  Altered sleeping 3 2 0  Tired, decreased energy 2 2 1   Change in appetite 2 2 1   Feeling bad or failure about yourself  1 0 0  Trouble concentrating 0 1 0  Moving slowly or fidgety/restless 1 0 0  Suicidal thoughts 0 0 0  PHQ-9 Score 11 9 2   Difficult doing work/chores - Somewhat difficult -   GAD 7 : Generalized Anxiety Score 02/27/2021 01/30/2021 02/04/2018  Nervous, Anxious, on Edge 2 2 0  Control/stop worrying 2 2 0  Worry too much - different things 2 2 0  Trouble relaxing 2 1 0  Restless 1 1 0  Easily annoyed or irritable 1 1 0  Afraid - awful might happen 3 3 0  Total GAD 7 Score 13 12 0  Anxiety Difficulty - Somewhat difficult -         ASSESSMENT & PLAN with other pertinent findings:  The primary encounter diagnosis was Essential hypertension. A diagnosis of Current moderate episode of major depressive disorder without prior episode [facility]) was also pertinent to this visit.    Patient Instructions  Referrals  [facility] [phone] for colon cancer screening  [facility] [phone]  Blood pressure  Increase Olmesartan 5 mg to 10 mg Week 1 and 20 mg Week 2 if BP >140/90  Will send MyChart message in 2 weeks to let me know what BP numbers are looking like  Checking BP first thing in AM will show  higher numbers, get a couple readings different times throughout the day   Labs today for medication safety   Mental health  Increase Lexapro from 10 mg to 20 mg   Will follow up with official visit in 4 weeks - virtual or in-office .     Orders Placed This Encounter  Procedures  . BASIC METABOLIC PANEL WITH GFR    Meds ordered this encounter  Medications  . olmesartan (BENICAR) 20 MG tablet    Sig: Take 0.5 tablets (10 mg total) by mouth at bedtime. Increase to 1 tablet (20 mg total) if BP >140/90 for more than 1 week    Dispense:  30 tablet    Refill:  1  . escitalopram (LEXAPRO) 20 MG tablet    Sig: Take 1 tablet (20 mg total) by mouth at bedtime.    Dispense:  30 tablet    Refill:  1     See below for relevant physical exam findings  See below for recent lab and imaging results reviewed  Medications, allergies, PMH, PSH, SocH, FamH reviewed below    Follow-up instructions: Return for VIRTUAL *OR* IN-OFFICE VISIT RECHECK MENTAL HEALTH .  Exam:  BP (!) 152/78 (BP Location: Left Arm, Patient Position: Sitting, Cuff Size: Normal)   Pulse 70   Temp 98.1 F (36.7 C) (Oral)   Wt 198 lb 1.9 oz (89.9 kg)   BMI 32.97 kg/m   Constitutional: VS see above. General Appearance: alert, well-developed, well-nourished, NAD  Neck: No masses, trachea midline.   Respiratory: Normal respiratory effort.   Musculoskeletal: Gait normal. Symmetric and independent movement of all extremities  Neurological: Normal balance/coordination. No tremor.  Skin: warm, dry, intact.   Psychiatric: Normal judgment/insight. Normal mood and affect. Oriented x3.   Current Meds  Medication Sig  . [DISCONTINUED] escitalopram (LEXAPRO) 10 MG tablet Take 1 tablet (10 mg total) by mouth at bedtime.  . [DISCONTINUED] olmesartan (BENICAR) 5 MG tablet Take 1 tablet (5 mg total) by mouth at bedtime.    Allergies   Allergen Reactions  . Codeine Anaphylaxis    Patient Active Problem List   Diagnosis Date Noted  . History of kidney stones 12/26/2017    Family History  Problem Relation Age of Onset  . Hypertension Mother   . Diabetes Mother   . High blood pressure Mother   . Hypertension Sister   . Heart attack Maternal Grandfather   . High blood pressure Maternal Grandfather   . Bone cancer Paternal Grandfather     Social History   Tobacco Use  Smoking Status Never Smoker  Smokeless Tobacco Never Used    Past Surgical History:  Procedure Laterality Date  . KIDNEY STONE SURGERY  05/2008    Immunization History  Administered Date(s) Administered  . Influenza-Unspecified 08/03/2017  . PFIZER(Purple Top)SARS-COV-2 Vaccination 12/30/2019, 01/20/2020    Recent Results (from the past 2160 hour(s))  CBC     Status: Abnormal   Collection Time: 01/30/21  9:55 AM  Result Value Ref Range   WBC 7.6 3.8 - 10.8 Thousand/uL   RBC 4.65 3.80 - 5.10 Million/uL   Hemoglobin 15.3 11.7 - 15.5 g/dL   HCT 41.7 (H) 40.8 - 14.4 %   MCV 97.2 80.0 - 100.0 fL   MCH 32.9 27.0 - 33.0 pg   MCHC 33.8 32.0 - 36.0 g/dL   RDW 81.8 56.3 - 14.9 %   Platelets 242 140 - 400 Thousand/uL   MPV 11.3 7.5 - 12.5 fL  COMPLETE METABOLIC PANEL WITH GFR     Status: Abnormal   Collection Time: 01/30/21  9:55 AM  Result Value Ref Range   Glucose, Bld 92 65 - 139 mg/dL    Comment: .        Non-fasting reference interval .    BUN 9 7 - 25 mg/dL   Creat 7.02 6.37 - 8.58 mg/dL   GFR, Est Non African American 104 > OR = 60 mL/min/1.12m2   GFR, Est African American 120 > OR = 60 mL/min/1.69m2   BUN/Creatinine Ratio NOT APPLICABLE 6 - 22 (calc)   Sodium 140 135 - 146 mmol/L   Potassium 4.4 3.5 - 5.3 mmol/L   Chloride 104 98 - 110 mmol/L   CO2 27 20 - 32 mmol/L   Calcium 9.4 8.6 - 10.2 mg/dL   Total Protein 6.7 6.1 - 8.1 g/dL   Albumin 4.3 3.6 - 5.1 g/dL   Globulin 2.4 1.9 - 3.7 g/dL (calc)   AG Ratio 1.8 1.0 -  2.5 (calc)   Total Bilirubin 1.5 (H) 0.2 - 1.2 mg/dL   Alkaline phosphatase (APISO) 76 31 - 125 U/L   AST 15 10 -  30 U/L   ALT 14 6 - 29 U/L    No results found.     All questions at time of visit were answered - patient instructed to contact office with any additional concerns or updates. ER/RTC precautions were reviewed with the patient as applicable.   Please note: manual typing as well as voice recognition software may have been used to produce this document - typos may escape review. Please contact Dr. Lyn Hollingshead for any needed clarifications.

## 2021-02-27 NOTE — Patient Instructions (Addendum)
Referrals  [facility] [phone] for colon cancer screening  Medical Weight Management [phone]  Blood pressure  Increase Olmesartan 5 mg to 10 mg Week 1 and 20 mg Week 2 if BP >140/90  Will send MyChart message in 2 weeks to let me know what BP numbers are looking like  Checking BP first thing in AM will show higher numbers, get a couple readings different times throughout the day   Labs today for medication safety   Mental health  Increase Lexapro from 10 mg to 20 mg   Will follow up with official visit in 4 weeks - virtual or in-office .

## 2021-02-28 LAB — BASIC METABOLIC PANEL WITH GFR
BUN: 11 mg/dL (ref 7–25)
CO2: 28 mmol/L (ref 20–32)
Calcium: 9 mg/dL (ref 8.6–10.2)
Chloride: 106 mmol/L (ref 98–110)
Creat: 0.68 mg/dL (ref 0.50–1.10)
GFR, Est African American: 123 mL/min/{1.73_m2} (ref >=60)
GFR, Est Non African American: 106 mL/min/{1.73_m2} (ref >=60)
Glucose, Bld: 89 mg/dL (ref 65–99)
Potassium: 4.1 mmol/L (ref 3.5–5.3)
Sodium: 143 mmol/L (ref 135–146)

## 2021-03-12 ENCOUNTER — Encounter: Payer: Self-pay | Admitting: Osteopathic Medicine

## 2021-03-22 ENCOUNTER — Other Ambulatory Visit: Payer: Self-pay

## 2021-03-22 ENCOUNTER — Encounter (INDEPENDENT_AMBULATORY_CARE_PROVIDER_SITE_OTHER): Payer: Self-pay | Admitting: Family Medicine

## 2021-03-22 ENCOUNTER — Ambulatory Visit (INDEPENDENT_AMBULATORY_CARE_PROVIDER_SITE_OTHER): Payer: Managed Care, Other (non HMO) | Admitting: Family Medicine

## 2021-03-22 VITALS — BP 108/68 | HR 70 | Temp 98.8°F | Ht 66.0 in | Wt 197.0 lb

## 2021-03-22 DIAGNOSIS — Z9189 Other specified personal risk factors, not elsewhere classified: Secondary | ICD-10-CM | POA: Diagnosis not present

## 2021-03-22 DIAGNOSIS — R0602 Shortness of breath: Secondary | ICD-10-CM

## 2021-03-22 DIAGNOSIS — I10 Essential (primary) hypertension: Secondary | ICD-10-CM | POA: Diagnosis not present

## 2021-03-22 DIAGNOSIS — Z6831 Body mass index (BMI) 31.0-31.9, adult: Secondary | ICD-10-CM

## 2021-03-22 DIAGNOSIS — R5383 Other fatigue: Secondary | ICD-10-CM

## 2021-03-22 DIAGNOSIS — E669 Obesity, unspecified: Secondary | ICD-10-CM

## 2021-03-22 DIAGNOSIS — Z0289 Encounter for other administrative examinations: Secondary | ICD-10-CM

## 2021-03-22 DIAGNOSIS — Z1331 Encounter for screening for depression: Secondary | ICD-10-CM

## 2021-03-22 DIAGNOSIS — F418 Other specified anxiety disorders: Secondary | ICD-10-CM

## 2021-03-22 NOTE — Progress Notes (Signed)
Office: [phone]  /  Fax: [phone]    Date: March 29, 2021    Appointment Start Time: 12:02pm Duration: 25 minutes Provider: Lawerance Cruel, Psy.D. Type of Session: Intake for Individual Therapy  Location of Patient: Parked in car at work Location of Provider: Provider's Home (private office) Type of Contact: Telepsychological Visit via MyChart Video Visit  Informed Consent: Prior to proceeding with today's appointment, two pieces of identifying information were obtained. In addition, Kavina's physical location at the time of this appointment was obtained as well a phone number she could be reached at in the event of technical difficulties. Alison Lucas and this provider participated in today's telepsychological service. Of note, Alison Lucas provided verbal consent to switch to a regular telephone call at 12:04pm due to ongoing audio issues.  The provider's role was explained to Alison Lucas. The provider reviewed and discussed issues of confidentiality, privacy, and limits therein (e.g., reporting obligations). In addition to verbal informed consent, written informed consent for psychological services was obtained prior to the initial appointment. Since the clinic is not a 24/7 crisis center, mental health emergency resources were shared and this  provider explained MyChart, e-mail, voicemail, and/or other messaging systems should be utilized only for non-emergency reasons. This provider also explained that information obtained during appointments will be placed in Aleesha's medical record and relevant information will be shared with other providers at [facility] for coordination of care. Alison Lucas agreed information may be shared with other [facility] providers as needed for coordination of care and by signing the service agreement document, she provided written consent for coordination of care. Prior to initiating telepsychological services, Alison Lucas completed an informed consent  document, which included the development of a safety plan (i.e., an emergency contact and emergency resources) in the event of an emergency/crisis. Alison Lucas expressed understanding of the rationale of the safety plan. Alison Lucas verbally acknowledged understanding she is ultimately responsible for understanding her insurance benefits for telepsychological and in-person services. This provider also reviewed confidentiality, as it relates to telepsychological services, as well as the rationale for telepsychological services (i.e., to reduce exposure risk to COVID-19). Alison Lucas  acknowledged understanding that appointments cannot be recorded without both party consent and she is aware she is responsible for securing confidentiality on her end of the session. Alison Lucas verbally consented to proceed.  Chief Complaint/HPI: Alison Lucas was referred by Dr. Quillian Quince due to depression with anxiety. Per the note for the initial visit with Dr. Quillian Quince on March 22, 2021, "Alison Lucas is stable on Lexapro. She is working on decreasing emotional eating behaviors." The note for the initial appointment with Dr. Quillian Quince indicated the following: "Her family eats meals together, she thinks her family will eat healthier with her, her desired weight loss is 27 lbs, she started gaining weight around age 62, her heaviest weight ever was 236 pounds, she is a picky eater and doesn't like to eat healthier foods, she has significant food cravings issues, she skips meals frequently, she is frequently drinking liquids with calories, she frequently makes poor food choices and she struggles with emotional eating." Alison Lucas's Food and Mood (modified PHQ-9) score on March 22, 2021 was 16.  During today's appointment, [name] was verbally administered a questionnaire assessing various behaviors related to emotional eating. Alison Lucas endorsed the following: overeat when you are celebrating, experience food cravings on a regular basis, use food to help you cope with  emotional situations, find food is comforting to you, overeat frequently when you are  bored or lonely, not worry about what you eat when you are in a good mood and eat as a reward. She shared she craves "sugar, carbs, anything sweet." Alison Lucas believes the onset of emotional eating was likely in the last 4-5 years, noting it was around the time her son graduated high school, joined [employer], and moved away. She described the current frequency of emotional eating as "once a week." In addition, Alison Lucas denied a history of binge eating. Alison Lucas denied a history of restricting food intake, purging and engagement in other compensatory strategies, and has never been diagnosed with an eating disorder. She also denied a history of treatment for emotional eating. Currently, Alison Lucas indicated anxiety and stress triggers emotional eating, whereas gardening, going for a walk, and engaging in other distractions makes emotional eating better. Furthermore, Alison Lucas stated she started focusing on her weight loss journey on her own in the past year, adding recent blood pressure concerns further motivated her to continue losing weight.   Mental Status Examination:  Appearance: well groomed and appropriate hygiene  Behavior: appropriate to circumstances Mood: euthymic Affect: mood congruent Speech: normal in rate, volume, and tone Eye Contact: appropriate Psychomotor Activity: unable to assess  Gait: unable to assess Thought Process: linear, logical, and goal directed  Thought Content/Perception: denies suicidal and homicidal ideation, plan, and intent and no hallucinations, delusions, bizarre thinking or behavior reported or observed Orientation: time, person, place, and purpose of appointment Memory/Concentration: memory, attention, language, and fund of knowledge intact  Insight/Judgment: good  Family & Psychosocial History: Alison Lucas reported she is engaged and she has one son (age 45). 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 Additionally, Alison Lucas shared her highest level of education obtained is a high school diploma. Currently, Taeya's social support system consists of her family, fiance, and few close friends. Moreover, Alison Lucas stated she resides with her fiance.   Medical History:  Past Medical History:  Diagnosis Date  . Anxiety   . COVID 07/2019   loss of taste and smell and fever resolved in 7 days  . Depression   . History of kidney stones   . Hypertension   . Other fatigue   . Shortness of breath on exertion    Past Surgical History:  Procedure Laterality Date  . KIDNEY STONE SURGERY  05/2008   Current Outpatient Medications on File Prior to Visit  Medication Sig Dispense Refill  . escitalopram (LEXAPRO) 20 MG tablet Take 1 tablet (20 mg total) by mouth at bedtime. 30 tablet 1  . olmesartan (BENICAR) 20 MG tablet Take 0.5 tablets (10 mg total) by mouth at bedtime. Increase to 1 tablet (20 mg total) if BP >140/90 for more than 1 week 30 tablet 1   No current facility-administered medications on file prior to visit.   Mental Health History: Alison Lucas reported she has never attended therapeutic services. She indicated her PCP current prescribes Lexapro, noting "it is kind of new." She denied any concerns. Alison Lucas reported there is no history of hospitalizations for psychiatric concerns. Alison Lucas denied a family history of mental health/substance abuse related concerns. Alison Lucas reported there is no history of trauma including psychological, physical  and sexual abuse, as well as neglect.   Alison Lucas described her typical mood lately as "better" since starting Lexapro, noting she was previously experiencing crying spells and worry thoughts about her son's well-being. Alison Lucas endorsed alcohol use, noting she consumes alcohol "maybe once a week" in the form of 1-2  standard pours of wine. She denied tobacco use. She denied illicit/recreational substance use. Regarding caffeine  intake, Roshawna reported consuming 1-2 cups of coffee (16oz) daily and occasionally consumes sweet tea. Furthermore, Latorsha indicated she is not experiencing the following: hallucinations and delusions, paranoia, symptoms of mania , social withdrawal, panic attacks and decreased motivation. She also denied history of and current suicidal ideation, plan, and intent; history of and current homicidal ideation, plan, and intent; and history of and current engagement in self-harm. Notably, Starlet endorsed item 9 (i.e., "Do you feel that your weight problem is so hopeless that sometimes life doesn't seem worth living?") on the modified PHQ-9 during her initial appointment with Dr. Quillian Quince on March 22, 2021. Onalee shared she endorsed the item due to feeling "stuck with the current weight" and denied endorsing the item due to suicidal ideation.   The following strengths were reported by Alison Leash: hard worker, motivated, "usually" positive, caring, and loving. The following strengths were observed by this provider: ability to express thoughts and feelings during the therapeutic session, ability to establish and benefit from a therapeutic relationship, willingness to work toward established goal(s) with the clinic and ability to engage in reciprocal conversation.   Legal History: Betul reported there is no history of legal involvement.   Structured Assessments Results: The Patient Health Questionnaire-9 (PHQ-9) is a self-report measure that assesses symptoms and severity of depression over the course of the last two weeks. Kortnee obtained a score of 5 suggesting mild depression. Dezi finds the endorsed symptoms to be somewhat difficult. [0= Not at all; 1= Several days; 2= More than half the days; 3= Nearly every day] Little interest or pleasure in doing things 0  Feeling down, depressed, or hopeless 0  Trouble falling or staying asleep, or sleeping too much 2  Feeling tired or having little energy 2  Poor appetite  or overeating 1  Feeling bad about yourself --- or that you are a failure or have let yourself or your family down 0  Trouble concentrating on things, such as reading the newspaper or watching television 0  Moving or speaking so slowly that other people could have noticed? Or the opposite --- being so fidgety or restless that you have been moving around a lot more than usual 0  Thoughts that you would be better off dead or hurting yourself in some way 0  PHQ-9 Score 5    The Generalized Anxiety Disorder-7 (GAD-7) is a brief self-report measure that assesses symptoms of anxiety over the course of the last two weeks. Crislyn obtained a score of 2 suggesting minimal anxiety. Jalayne finds the endorsed symptoms to be not difficult at all. [0= Not at all; 1= Several days; 2= Over half the days; 3= Nearly every day] Feeling nervous, anxious, on edge 0  Not being able to stop or control worrying 1  Worrying too much about different things 0  Trouble relaxing 0  Being so restless that it's hard to sit still 0  Becoming easily annoyed or irritable 0  Feeling afraid as if something awful might happen-related to son being in the military 1  GAD-7 Score 2   Interventions:  Conducted a chart review Focused on rapport building Verbally administered PHQ-9 and GAD-7 for symptom monitoring Verbally administered Food & Mood questionnaire to assess various behaviors related to emotional eating Provided emphatic reflections and validation Psychoeducation provided regarding physical versus emotional hunger  Provisional DSM-5 Diagnosis(es): 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 She acknowledged understanding  that she may request a follow-up appointment with this provider in the future as long as she is still established with the clinic. Ikesha will be sent a handout via e-mail to increase awareness of hunger patterns and subsequent eating. Dezyre provided verbal consent during today's appointment for this provider to send the handout via e-mail. No further follow-up planned by this provider.

## 2021-03-23 LAB — COMPREHENSIVE METABOLIC PANEL
ALT: 12 IU/L (ref 0–32)
AST: 18 IU/L (ref 0–40)
Albumin/Globulin Ratio: 1.6 (ref 1.2–2.2)
Albumin: 4.1 g/dL (ref 3.8–4.8)
Alkaline Phosphatase: 98 IU/L (ref 44–121)
BUN/Creatinine Ratio: 14 (ref 9–23)
BUN: 10 mg/dL (ref 6–24)
Bilirubin Total: 0.5 mg/dL (ref 0.0–1.2)
CO2: 21 mmol/L (ref 20–29)
Calcium: 8.9 mg/dL (ref 8.7–10.2)
Chloride: 102 mmol/L (ref 96–106)
Creatinine, Ser: 0.69 mg/dL (ref 0.57–1.00)
Globulin, Total: 2.5 g/dL (ref 1.5–4.5)
Glucose: 83 mg/dL (ref 65–99)
Potassium: 4.7 mmol/L (ref 3.5–5.2)
Sodium: 142 mmol/L (ref 134–144)
Total Protein: 6.6 g/dL (ref 6.0–8.5)
eGFR: 110 mL/min/{1.73_m2} (ref >59)

## 2021-03-23 LAB — LIPID PANEL WITH LDL/HDL RATIO
Cholesterol, Total: 192 mg/dL (ref 100–199)
HDL: 61 mg/dL (ref >39)
LDL Chol Calc (NIH): 115 mg/dL — ABNORMAL HIGH (ref 0–99)
LDL/HDL Ratio: 1.9 ratio (ref 0.0–3.2)
Triglycerides: 87 mg/dL (ref 0–149)
VLDL Cholesterol Cal: 16 mg/dL (ref 5–40)

## 2021-03-23 LAB — T4: T4, Total: 6.3 ug/dL (ref 4.5–12.0)

## 2021-03-23 LAB — CBC WITH DIFFERENTIAL/PLATELET
Basophils Absolute: 0.1 10*3/uL (ref 0.0–0.2)
Basos: 1 %
EOS (ABSOLUTE): 0.2 10*3/uL (ref 0.0–0.4)
Eos: 2 %
Hematocrit: 45.2 % (ref 34.0–46.6)
Hemoglobin: 15 g/dL (ref 11.1–15.9)
Immature Grans (Abs): 0.1 10*3/uL (ref 0.0–0.1)
Immature Granulocytes: 1 %
Lymphocytes Absolute: 2.1 10*3/uL (ref 0.7–3.1)
Lymphs: 25 %
MCH: 32.5 pg (ref 26.6–33.0)
MCHC: 33.2 g/dL (ref 31.5–35.7)
MCV: 98 fL — ABNORMAL HIGH (ref 79–97)
Monocytes Absolute: 0.6 10*3/uL (ref 0.1–0.9)
Monocytes: 8 %
Neutrophils Absolute: 5.2 10*3/uL (ref 1.4–7.0)
Neutrophils: 63 %
Platelets: 229 10*3/uL (ref 150–450)
RBC: 4.62 x10E6/uL (ref 3.77–5.28)
RDW: 12.5 % (ref 11.7–15.4)
WBC: 8.2 10*3/uL (ref 3.4–10.8)

## 2021-03-23 LAB — HEMOGLOBIN A1C
Est. average glucose Bld gHb Est-mCnc: 105 mg/dL
Hgb A1c MFr Bld: 5.3 % (ref 4.8–5.6)

## 2021-03-23 LAB — INSULIN, RANDOM: INSULIN: 16.6 u[IU]/mL (ref 2.6–24.9)

## 2021-03-23 LAB — TSH: TSH: 1.07 u[IU]/mL (ref 0.450–4.500)

## 2021-03-23 LAB — VITAMIN D 25 HYDROXY (VIT D DEFICIENCY, FRACTURES): Vit D, 25-Hydroxy: 42.8 ng/mL (ref 30.0–100.0)

## 2021-03-23 LAB — VITAMIN B12: Vitamin B-12: 232 pg/mL (ref 232–1245)

## 2021-03-23 LAB — FOLATE: Folate: 6.4 ng/mL (ref >3.0)

## 2021-03-23 LAB — T3: T3, Total: 95 ng/dL (ref 71–180)

## 2021-03-28 ENCOUNTER — Telehealth (INDEPENDENT_AMBULATORY_CARE_PROVIDER_SITE_OTHER): Payer: Managed Care, Other (non HMO) | Admitting: Psychology

## 2021-03-28 NOTE — Progress Notes (Signed)
Chief Complaint:   OBESITY Alison Lucas (MR# [id number]) is a 45 y.o. female who presents for evaluation and treatment of obesity and related comorbidities. Current BMI is Body mass index is 31.8 kg/m. Alison Lucas has been struggling with her weight for many years and has been unsuccessful in either losing weight, maintaining weight loss, or reaching her healthy weight goal.  Alison Lucas has already lost >30 lbs on her own this year.  Alison Lucas is currently in the action stage of change and ready to dedicate time achieving and maintaining a healthier weight. Alison Lucas is interested in becoming our patient and working on intensive lifestyle modifications including (but not limited to) diet and exercise for weight loss.  Alison Lucas's habits were reviewed today and are as follows: Her family eats meals together, she thinks her family will eat healthier with her, her desired weight loss is 27 lbs, she started gaining weight around age 1, her heaviest weight ever was 236 pounds, she is a picky eater and doesn't like to eat healthier foods, she has significant food cravings issues, she skips meals frequently, she is frequently drinking liquids with calories, she frequently makes poor food choices and she struggles with emotional eating.  Depression Screen Alison Lucas's Food and Mood (modified PHQ-9) score was 16.  Depression screen Alison Lucas 2/9 03/22/2021  Decreased Interest 2  Down, Depressed, Hopeless 3  PHQ - 2 Score 5  Altered sleeping 2  Tired, decreased energy 3  Change in appetite 2  Feeling bad or failure about yourself  3  Trouble concentrating 0  Moving slowly or fidgety/restless 0  Suicidal thoughts 1  PHQ-9 Score 16  Difficult doing work/chores Somewhat difficult   Subjective:   1. Other fatigue Alison Lucas admits to daytime somnolence and admits to waking up still tired. Patent has a history of symptoms of daytime fatigue. Alison Lucas generally gets 6 or 7 hours of sleep per night, and states that she has nightime  awakenings. Snoring is not present. Apneic episodes are not present. Epworth Sleepiness Score is 11.  2. Shortness of breath on exertion Alison Lucas notes increasing shortness of breath with exercising and seems to be worsening over time with weight gain. She notes getting out of breath sooner with activity than she used to. This has not gotten worse recently. Alison Lucas denies shortness of breath at rest or orthopnea.  3. Essential hypertension Alison Lucas's blood pressure is well controlled on her medications. She is working on weight loss to help improve her blood pressure and avoid heart disease.  4. Depression with anxiety Alison Lucas is stable on Lexapro. She is working on decreasing emotional eating behaviors.  5. At risk for heart disease Alison Lucas is at a higher than average risk for cardiovascular disease due to obesity.   Assessment/Plan:   1. Other fatigue Alison Lucas does feel that her weight is causing her energy to be lower than it should be. Fatigue may be related to obesity, depression or many other causes. Labs will be ordered, and in the meanwhile, Alison Lucas will focus on self care including making healthy food choices, increasing physical activity and focusing on stress reduction.  - Vitamin B12 - CBC with Differential/Platelet - EKG 12-Lead - Folate - Hemoglobin A1c - Insulin, random - Lipid Panel With LDL/HDL Ratio - T4 - T3 - TSH - VITAMIN D 25 Hydroxy (Vit-D Deficiency, Fractures)  2. Shortness of breath on exertion Alison Lucas does feel that she gets out of breath more easily that she used to when she exercises. Alison Lucas's  shortness of breath appears to be obesity related and exercise induced. She has agreed to work on weight loss and gradually increase exercise to treat her exercise induced shortness of breath. Will continue to monitor closely.  3. Essential hypertension Alison Lucas will start on her journaling plan, and will work on healthy weight loss and exercise to improve blood pressure control. We  will watch for signs of hypotension as she continues her lifestyle modifications. We will check labs today.  - Comprehensive metabolic panel  4. Depression with anxiety Behavior modification techniques were discussed today to help Alison Lucas deal with her emotional/non-hunger eating behaviors. We will refer Alison Lucas to Alison Lucas, our Bariatric Psychologist for evaluation. Orders and follow up as documented in patient record.   5. At risk for heart disease Alison Lucas was given approximately 30 minutes of coronary artery disease prevention counseling today. She is 45 y.o. female and has risk factors for heart disease including obesity. We discussed intensive lifestyle modifications today with an emphasis on specific weight loss instructions and strategies.   Repetitive spaced learning was employed today to elicit superior memory formation and behavioral change.  6. Class 1 obesity with serious comorbidity and body mass index (BMI) of 31.0 to 31.9 in adult, unspecified obesity type Alison Lucas is currently in the action stage of change and her goal is to continue with weight loss efforts. I recommend Alison Lucas begin the structured treatment plan as follows:  She has agreed to keeping a food journal and adhering to recommended goals of 1400-1600 calories and 85+ grams of protein daily.  Exercise goals: No exercise has been prescribed for now, while we concentrate on nutritional changes.  Behavioral modification strategies: increasing lean protein intake, decreasing simple carbohydrates, decreasing eating out and no skipping meals.  She was informed of the importance of frequent follow-up visits to maximize her success with intensive lifestyle modifications for her multiple health conditions. She was informed we would discuss her lab results at her next visit unless there is a critical issue that needs to be addressed sooner. Alison Lucas agreed to keep her next visit at the agreed upon time to discuss these  results.  Objective:   Blood pressure 108/68, pulse 70, temperature 98.8 F (37.1 C), height 5\' 6"  (1.676 m), weight 197 lb (89.4 kg), SpO2 96 %. Body mass index is 31.8 kg/m.  EKG: Normal sinus rhythm, rate 68 BPM.  Indirect Calorimeter completed today shows a VO2 of 292 and a REE of 2031.  Her calculated basal metabolic rate is 2032 thus her basal metabolic rate is better than expected.  General: Cooperative, alert, well developed, in no acute distress. HEENT: Conjunctivae and lids unremarkable. Cardiovascular: Regular rhythm.  Lungs: Normal work of breathing. Neurologic: No focal deficits.   Lab Results  Component Value Date   CREATININE 0.69 03/22/2021   BUN 10 03/22/2021   NA 142 03/22/2021   K 4.7 03/22/2021   CL 102 03/22/2021   CO2 21 03/22/2021   Lab Results  Component Value Date   ALT 12 03/22/2021   AST 18 03/22/2021   ALKPHOS 98 03/22/2021   BILITOT 0.5 03/22/2021   Lab Results  Component Value Date   HGBA1C 5.3 03/22/2021   Lab Results  Component Value Date   INSULIN 16.6 03/22/2021   Lab Results  Component Value Date   TSH 1.070 03/22/2021   Lab Results  Component Value Date   CHOL 192 03/22/2021   HDL 61 03/22/2021   LDLCALC 115 (H) 03/22/2021   TRIG  87 03/22/2021   CHOLHDL 4.3 12/26/2017   Lab Results  Component Value Date   WBC 8.2 03/22/2021   HGB 15.0 03/22/2021   HCT 45.2 03/22/2021   MCV 98 (H) 03/22/2021   PLT 229 03/22/2021   No results found for: IRON, TIBC, FERRITIN  Attestation Statements:   Reviewed by clinician on day of visit: allergies, medications, problem list, medical history, surgical history, family history, social history, and previous encounter notes.   I, Burt Knack, am acting as transcriptionist for Quillian Quince, MD.  I have reviewed the above documentation for accuracy and completeness, and I agree with the above. - Quillian Quince, MD

## 2021-03-29 ENCOUNTER — Telehealth (INDEPENDENT_AMBULATORY_CARE_PROVIDER_SITE_OTHER): Payer: Managed Care, Other (non HMO) | Admitting: Psychology

## 2021-03-29 DIAGNOSIS — F418 Other specified anxiety disorders: Secondary | ICD-10-CM

## 2021-04-03 ENCOUNTER — Telehealth: Payer: Managed Care, Other (non HMO) | Admitting: Osteopathic Medicine

## 2021-04-05 ENCOUNTER — Telehealth (INDEPENDENT_AMBULATORY_CARE_PROVIDER_SITE_OTHER): Payer: Self-pay | Admitting: Psychology

## 2021-04-05 ENCOUNTER — Ambulatory Visit (INDEPENDENT_AMBULATORY_CARE_PROVIDER_SITE_OTHER): Payer: Managed Care, Other (non HMO) | Admitting: Family Medicine

## 2021-04-05 ENCOUNTER — Encounter (INDEPENDENT_AMBULATORY_CARE_PROVIDER_SITE_OTHER): Payer: Self-pay | Admitting: Family Medicine

## 2021-04-05 ENCOUNTER — Other Ambulatory Visit: Payer: Self-pay

## 2021-04-05 VITALS — BP 114/75 | HR 66 | Temp 98.1°F | Ht 66.0 in | Wt 196.0 lb

## 2021-04-05 DIAGNOSIS — E7849 Other hyperlipidemia: Secondary | ICD-10-CM

## 2021-04-05 DIAGNOSIS — E781 Pure hyperglyceridemia: Secondary | ICD-10-CM

## 2021-04-05 DIAGNOSIS — Z9189 Other specified personal risk factors, not elsewhere classified: Secondary | ICD-10-CM | POA: Diagnosis not present

## 2021-04-05 DIAGNOSIS — Z6831 Body mass index (BMI) 31.0-31.9, adult: Secondary | ICD-10-CM

## 2021-04-05 DIAGNOSIS — E8881 Metabolic syndrome: Secondary | ICD-10-CM | POA: Diagnosis not present

## 2021-04-05 DIAGNOSIS — E669 Obesity, unspecified: Secondary | ICD-10-CM | POA: Diagnosis not present

## 2021-04-05 DIAGNOSIS — E559 Vitamin D deficiency, unspecified: Secondary | ICD-10-CM | POA: Diagnosis not present

## 2021-04-05 MED ORDER — VITAMIN D (ERGOCALCIFEROL) 1.25 MG (50000 UNIT) PO CAPS: 50000.0000 [IU] | ORAL_CAPSULE | ORAL | 0 refills | Status: DC

## 2021-04-05 MED ORDER — METFORMIN HCL 500 MG PO TABS: 500.0000 mg | ORAL_TABLET | Freq: Every day | ORAL | 0 refills | Status: DC

## 2021-04-06 NOTE — Progress Notes (Signed)
Chief Complaint:   OBESITY Alison Lucas is here to discuss her progress with her obesity treatment plan along with follow-up of her obesity related diagnoses. Alison Lucas is on keeping a food journal and adhering to recommended goals of 1400-1600 calories and 85+ grams of protein daily and states she is following her eating plan approximately 75% of the time. Alison Lucas states she is walking for 40 minutes 3 times per week.  Today's visit was #: 2 Starting weight: 197 lbs Starting date: 03/22/2021 Today's weight: 196 lbs Today's date: 04/05/2021 Total lbs lost to date: 1 Total lbs lost since last in-office visit: 1  Interim History: Alison Lucas did well with weight loss and she is starting to do better with journaling. She did better with journaling on the weekdays and she did well mostly meeting her protein goals.  Subjective:   1. Vitamin D deficiency Alison Lucas has a new diagnosis of Vit D deficiency. Her Vit D level is close to goal, but not yet there. She is not on Vit D. I discussed labs with the patient today.  2. Hyperlipidemia, pure Alison Lucas's LDL is elevated, and she is not on statin. She wants to attempt to control with diet and weight loss. I discussed labs with the patient today.  3. Insulin resistance Alison Lucas has a new diagnosis of insulin resistance. She has a normal glucose and A1c, but her fasting insulin is elevated. She notes polyphagia. I discussed labs with the patient today.  4. At risk for diabetes mellitus Alison Lucas is at higher than average risk for developing diabetes due to obesity.   Assessment/Plan:   1. Vitamin D deficiency Low Vitamin D level contributes to fatigue and are associated with obesity, breast, and colon cancer. Alison Lucas agreed to start prescription Vitamin D 50,000 IU every week with no refills. She will follow-up for routine testing of Vitamin D, at least 2-3 times per year to avoid over-replacement.  - Vitamin D, Ergocalciferol, (DRISDOL) 1.25 MG (50000 UNIT) CAPS capsule;  Take 1 capsule (50,000 Units total) by mouth every 7 (seven) days.  Dispense: 4 capsule; Refill: 0  2. Hyperlipidemia, pure Cardiovascular risk and specific lipid/LDL goals reviewed. We discussed several lifestyle modifications today. Alison Lucas will continue to work on diet, exercise and weight loss efforts. Orders and follow up as documented in patient record.   3. Insulin resistance Alison Lucas agreed to start metformin 500 mg q AM with no refills. She will continue to work on weight loss, diet, exercise, and decreasing simple carbohydrates to help decrease the risk of diabetes. Alison Lucas agreed to follow-up with [place] as directed to closely monitor her progress.  - metFORMIN (GLUCOPHAGE) 500 MG tablet; Take 1 tablet (500 mg total) by mouth daily with breakfast.  Dispense: 30 tablet; Refill: 0  4. At risk for diabetes mellitus Alison Lucas was given approximately 30 minutes of diabetes education and counseling today. We discussed intensive lifestyle modifications today with an emphasis on weight loss as well as increasing exercise and decreasing simple carbohydrates in her diet. We also reviewed medication options with an emphasis on risk versus benefit of those discussed.   Repetitive spaced learning was employed today to elicit superior memory formation and behavioral change.  5. Obesity with current BMI 31.7 Alison Lucas is currently in the action stage of change. As such, her goal is to continue with weight loss efforts. She has agreed to keeping a food journal and adhering to recommended goals of 1400-1600 calories and 85+ grams of protein daily.   Exercise goals:  As is.  Behavioral modification strategies: increasing lean protein intake and meal planning and cooking strategies.  Alison Lucas has agreed to follow-up with our clinic in 2 weeks. She was informed of the importance of frequent follow-up visits to maximize her success with intensive lifestyle modifications for her multiple health conditions.   Objective:    Blood pressure 114/75, pulse 66, temperature 98.1 F (36.7 C), height 5\' 6"  (1.676 m), weight 196 lb (88.9 kg), SpO2 96 %. Body mass index is 31.64 kg/m.  General: Cooperative, alert, well developed, in no acute distress. HEENT: Conjunctivae and lids unremarkable. Cardiovascular: Regular rhythm.  Lungs: Normal work of breathing. Neurologic: No focal deficits.   Lab Results  Component Value Date   CREATININE 0.69 03/22/2021   BUN 10 03/22/2021   NA 142 03/22/2021   K 4.7 03/22/2021   CL 102 03/22/2021   CO2 21 03/22/2021   Lab Results  Component Value Date   ALT 12 03/22/2021   AST 18 03/22/2021   ALKPHOS 98 03/22/2021   BILITOT 0.5 03/22/2021   Lab Results  Component Value Date   HGBA1C 5.3 03/22/2021   Lab Results  Component Value Date   INSULIN 16.6 03/22/2021   Lab Results  Component Value Date   TSH 1.070 03/22/2021   Lab Results  Component Value Date   CHOL 192 03/22/2021   HDL 61 03/22/2021   LDLCALC 115 (H) 03/22/2021   TRIG 87 03/22/2021   CHOLHDL 4.3 12/26/2017   Lab Results  Component Value Date   WBC 8.2 03/22/2021   HGB 15.0 03/22/2021   HCT 45.2 03/22/2021   MCV 98 (H) 03/22/2021   PLT 229 03/22/2021   No results found for: IRON, TIBC, FERRITIN  Attestation Statements:   Reviewed by clinician on day of visit: allergies, medications, problem list, medical history, surgical history, family history, social history, and previous encounter notes.   I, 03/24/2021, am acting as transcriptionist for Burt Knack, MD.  I have reviewed the above documentation for accuracy and completeness, and I agree with the above. -  Quillian Quince, MD

## 2021-04-18 ENCOUNTER — Encounter (INDEPENDENT_AMBULATORY_CARE_PROVIDER_SITE_OTHER): Payer: Self-pay | Admitting: Family Medicine

## 2021-04-18 ENCOUNTER — Other Ambulatory Visit: Payer: Self-pay

## 2021-04-18 ENCOUNTER — Ambulatory Visit (INDEPENDENT_AMBULATORY_CARE_PROVIDER_SITE_OTHER): Payer: Managed Care, Other (non HMO) | Admitting: Family Medicine

## 2021-04-18 VITALS — BP 108/71 | HR 76 | Temp 98.3°F | Ht 66.0 in | Wt 197.0 lb

## 2021-04-18 DIAGNOSIS — Z9189 Other specified personal risk factors, not elsewhere classified: Secondary | ICD-10-CM | POA: Diagnosis not present

## 2021-04-18 DIAGNOSIS — E66811 Obesity, class 1: Secondary | ICD-10-CM

## 2021-04-18 DIAGNOSIS — E88819 Insulin resistance, unspecified: Secondary | ICD-10-CM

## 2021-04-18 DIAGNOSIS — Z6831 Body mass index (BMI) 31.0-31.9, adult: Secondary | ICD-10-CM

## 2021-04-18 DIAGNOSIS — E8881 Metabolic syndrome: Secondary | ICD-10-CM | POA: Diagnosis not present

## 2021-04-18 DIAGNOSIS — E559 Vitamin D deficiency, unspecified: Secondary | ICD-10-CM

## 2021-04-18 DIAGNOSIS — F3289 Other specified depressive episodes: Secondary | ICD-10-CM | POA: Diagnosis not present

## 2021-04-18 DIAGNOSIS — E669 Obesity, unspecified: Secondary | ICD-10-CM | POA: Diagnosis not present

## 2021-04-18 MED ORDER — METFORMIN HCL 500 MG PO TABS: 500.0000 mg | ORAL_TABLET | Freq: Every day | ORAL | 0 refills | Status: DC

## 2021-04-18 MED ORDER — BUPROPION HCL ER (SR) 150 MG PO TB12: 150.0000 mg | ORAL_TABLET | Freq: Every day | ORAL | 0 refills | Status: DC

## 2021-04-18 MED ORDER — VITAMIN D (ERGOCALCIFEROL) 1.25 MG (50000 UNIT) PO CAPS: 50000.0000 [IU] | ORAL_CAPSULE | ORAL | 0 refills | Status: DC

## 2021-04-18 NOTE — Progress Notes (Signed)
Chief Complaint:   OBESITY Alison Lucas is here to discuss her progress with her obesity treatment plan along with follow-up of her obesity related diagnoses. Alison Lucas is on keeping a food journal and adhering to recommended goals of 1400-1600 calories and 85+ grams of protein daily and states she is following her eating plan approximately 50% of the time. Alison Lucas states she is walking for 45 minutes 3-4 times per week.  Today's visit was #: 3 Starting weight: 197 lbs Starting date: 03/22/2021 Today's weight: 197 lbs Today's date: 04/18/2021 Total lbs lost to date: 0 Total lbs lost since last in-office visit: 0  Interim History: Alison Lucas is working on Orthoptist and she is being mindful overall. She struggles with journaling dinner at times and she notes increased cravings in the late afternoon and early evening.  Subjective:   1. Vitamin D deficiency Alison Lucas is stable on Vit D, and she denies nausea or vomiting.  2. Insulin resistance Alison Lucas is tolerating metformin well. She is not really noticing any differences yet.  3. Other depression with emotional eating Alison Lucas notes increased cravings especially in the late afternoon, and especially for sweet foods. She is on Lexapro and she is doing well with this.  4. At risk for impaired metabolic function Alison Lucas is at increased risk for impaired metabolic function if protein decreases.  Assessment/Plan:   1. Vitamin D deficiency Low Vitamin D level contributes to fatigue and are associated with obesity, breast, and colon cancer. We will refillprescription Vitamin D for 1 month. Alison Lucas will follow-up for routine testing of Vitamin D, at least 2-3 times per year to avoid over-replacement.  - Vitamin D, Ergocalciferol, (DRISDOL) 1.25 MG (50000 UNIT) CAPS capsule; Take 1 capsule (50,000 Units total) by mouth every 7 (seven) days.  Dispense: 4 capsule; Refill: 0  2. Insulin resistance Alison Lucas will continue to work on weight loss, exercise, and decreasing  simple carbohydrates to help decrease the risk of diabetes. We will refill metformin for 1 month; we may consider increasing her dose at her next visit. Alison Lucas agreed to follow-up with [place] as directed to closely monitor her progress.  - metFORMIN (GLUCOPHAGE) 500 MG tablet; Take 1 tablet (500 mg total) by mouth daily with breakfast.  Dispense: 30 tablet; Refill: 0  3. Other depression with emotional eating Behavior modification techniques were discussed today to help Alison Lucas deal with her emotional/non-hunger eating behaviors. Alison Lucas agreed to start Wellbutrin SR 150 mg q AM with no refills, and she will continue Lexapro as is and will monitor closely. Orders and follow up as documented in patient record.   - buPROPion (WELLBUTRIN SR) 150 MG 12 hr tablet; Take 1 tablet (150 mg total) by mouth daily with breakfast.  Dispense: 30 tablet; Refill: 0  4. At risk for impaired metabolic function Alison Lucas was given approximately 15 minutes of impaired  metabolic function prevention counseling today. We discussed intensive lifestyle modifications today with an emphasis on specific nutrition and exercise instructions and strategies.   Repetitive spaced learning was employed today to elicit superior memory formation and behavioral change.  5. Obesity with current BMI 31.9 Alison Lucas is currently in the action stage of change. As such, her goal is to continue with weight loss efforts. She has agreed to keeping a food journal and adhering to recommended goals of 1400-1600 calories and 85+ grams of protein daily.   Exercise goals: As is.  Behavioral modification strategies: increasing lean protein intake, emotional eating strategies and keeping a strict food journal.  Alison Lucas has agreed to follow-up with our clinic in 2 weeks. She was informed of the importance of frequent follow-up visits to maximize her success with intensive lifestyle modifications for her multiple health conditions.   Objective:   Blood pressure  108/71, pulse 76, temperature 98.3 F (36.8 C), height 5\' 6"  (1.676 m), weight 197 lb (89.4 kg), SpO2 97 %. Body mass index is 31.8 kg/m.  General: Cooperative, alert, well developed, in no acute distress. HEENT: Conjunctivae and lids unremarkable. Cardiovascular: Regular rhythm.  Lungs: Normal work of breathing. Neurologic: No focal deficits.   Lab Results  Component Value Date   CREATININE 0.69 03/22/2021   BUN 10 03/22/2021   NA 142 03/22/2021   K 4.7 03/22/2021   CL 102 03/22/2021   CO2 21 03/22/2021   Lab Results  Component Value Date   ALT 12 03/22/2021   AST 18 03/22/2021   ALKPHOS 98 03/22/2021   BILITOT 0.5 03/22/2021   Lab Results  Component Value Date   HGBA1C 5.3 03/22/2021   Lab Results  Component Value Date   INSULIN 16.6 03/22/2021   Lab Results  Component Value Date   TSH 1.070 03/22/2021   Lab Results  Component Value Date   CHOL 192 03/22/2021   HDL 61 03/22/2021   LDLCALC 115 (H) 03/22/2021   TRIG 87 03/22/2021   CHOLHDL 4.3 12/26/2017   Lab Results  Component Value Date   WBC 8.2 03/22/2021   HGB 15.0 03/22/2021   HCT 45.2 03/22/2021   MCV 98 (H) 03/22/2021   PLT 229 03/22/2021   No results found for: IRON, TIBC, FERRITIN  Attestation Statements:   Reviewed by clinician on day of visit: allergies, medications, problem list, medical history, surgical history, family history, social history, and previous encounter notes.   I, 03/24/2021, am acting as transcriptionist for Burt Knack, MD.  I have reviewed the above documentation for accuracy and completeness, and I agree with the above. -  Quillian Quince, MD

## 2021-04-19 ENCOUNTER — Other Ambulatory Visit: Payer: Self-pay

## 2021-04-19 ENCOUNTER — Encounter: Payer: Self-pay | Admitting: Osteopathic Medicine

## 2021-04-19 ENCOUNTER — Ambulatory Visit (INDEPENDENT_AMBULATORY_CARE_PROVIDER_SITE_OTHER): Payer: Managed Care, Other (non HMO) | Admitting: Osteopathic Medicine

## 2021-04-19 VITALS — BP 123/71 | HR 73 | Temp 98.6°F | Wt 201.1 lb

## 2021-04-19 DIAGNOSIS — I10 Essential (primary) hypertension: Secondary | ICD-10-CM

## 2021-04-19 DIAGNOSIS — E7849 Other hyperlipidemia: Secondary | ICD-10-CM

## 2021-04-19 DIAGNOSIS — Z6831 Body mass index (BMI) 31.0-31.9, adult: Secondary | ICD-10-CM

## 2021-04-19 DIAGNOSIS — F418 Other specified anxiety disorders: Secondary | ICD-10-CM | POA: Diagnosis not present

## 2021-04-19 DIAGNOSIS — E669 Obesity, unspecified: Secondary | ICD-10-CM | POA: Diagnosis not present

## 2021-04-19 MED ORDER — OLMESARTAN MEDOXOMIL 20 MG PO TABS: 20.0000 mg | ORAL_TABLET | Freq: Every day | ORAL | 3 refills | Status: AC

## 2021-04-19 MED ORDER — ESCITALOPRAM OXALATE 20 MG PO TABS: 20.0000 mg | ORAL_TABLET | Freq: Every day | ORAL | 3 refills | Status: AC

## 2021-04-19 NOTE — Patient Instructions (Signed)
Refilled meds for a year:  Meds ordered this encounter  Medications  . olmesartan (BENICAR) 20 MG tablet    Sig: Take 1 tablet (20 mg total) by mouth at bedtime.    Dispense:  90 tablet    Refill:  3  . escitalopram (LEXAPRO) 20 MG tablet    Sig: Take 1 tablet (20 mg total) by mouth at bedtime.    Dispense:  90 tablet    Refill:  3   Watch BP numbers: As weight decreases, BP often goes down too Message me if any dizziness and/or low BP (<90/70)

## 2021-04-19 NOTE — Progress Notes (Signed)
Alison Lucas is a 45 y.o. female who presents to  [place] [facility]  today, 04/19/21, seeking care for the following:  . Follow-up BP and mental health  o Mental health: doing well on Lexapro 20 mg, see PHG/GAD below  o BP: numbers at goal! Pt taking olmesartan  o Labs reviewed from medical weight management: No concerns on CBC, CMP, B12, Folate, A1C, Lipids, TSH, Vitamin D   Depression screen [facility] 2/9 04/19/2021 03/22/2021 02/27/2021  Decreased Interest 0 2 1  Down, Depressed, Hopeless _0 PHQ - 2 Score _1 Altered sleeping _2 Tired, decreased energy _3 Change in appetite _4 Feeling bad or failure about yourself  0 3 1  Trouble concentrating 0 0 0  Moving slowly or fidgety/restless 0 0 1  Suicidal thoughts 0 1 0  PHQ-9 Score _5 Difficult doing work/chores Not difficult at all Somewhat difficult -   GAD 7 : Generalized Anxiety Score 04/19/2021 02/27/2021 01/30/2021 02/04/2018  Nervous, Anxious, on Edge _6 0  Control/stop worrying 0 2 2 0  Worry too much - different things _7 0  Trouble relaxing 0 2 1 0  Restless 0 1 1 0  Easily annoyed or irritable 0 1 1 0  Afraid - awful might happen _8 0  Total GAD 7 Score _9 0  Anxiety Difficulty Not difficult at all - Somewhat difficult -    BP Readings from Last 3 Encounters:  04/19/21 123/71  04/18/21 108/71  04/05/21 114/75       ASSESSMENT & PLAN with other pertinent findings:  The primary encounter diagnosis was Essential hypertension. Diagnoses of Other Specified Anxiety Disorder, Emotional Eating Behaviors, Hyperlipidemia, pure, and Obesity with current BMI 31.9 were also pertinent to this visit.    Patient Instructions   Refilled meds for a year:  Meds ordered this encounter  Medications  . olmesartan (BENICAR) 20 MG tablet    Sig: Take 1 tablet (20 mg total) by mouth at bedtime.    Dispense:  90 tablet    Refill:  3  . escitalopram  (LEXAPRO) 20 MG tablet    Sig: Take 1 tablet (20 mg total) by mouth at bedtime.    Dispense:  90 tablet    Refill:  3   Watch BP numbers: As weight decreases, BP often goes down too Message me if any dizziness and/or low BP (<90/70)      No orders of the defined types were placed in this encounter.      See below for relevant physical exam findings  See below for recent lab and imaging results reviewed  Medications, allergies, PMH, PSH, SocH, FamH reviewed below    Follow-up instructions: Return [facility].                                        Exam:  BP 123/71 (BP Location: Left Arm, Patient Position: Sitting, Cuff Size: Normal)   Pulse 73   Temp 98.6 F (37 C) (Oral)   Wt 201 lb 1.9 oz (91.2 kg)   BMI 32.46 kg/m   Constitutional: VS see above. General Appearance: alert, well-developed, well-nourished, NAD  Neck: No masses, trachea midline.   Respiratory: Normal respiratory effort.  no wheeze, no rhonchi, no rales  Cardiovascular: S1/S2 normal, no murmur, no rub/gallop auscultated. RRR.   Musculoskeletal: Gait normal.   Neurological: Normal balance/coordination. No tremor.  Skin: warm, dry, intact.   Psychiatric: Normal judgment/insight. Normal mood and affect. Oriented x3.   Current Meds  Medication Sig  . buPROPion (WELLBUTRIN SR) 150 MG 12 hr tablet Take 1 tablet (150 mg total) by mouth daily with breakfast.  . metFORMIN (GLUCOPHAGE) 500 MG tablet Take 1 tablet (500 mg total) by mouth daily with breakfast.  . Vitamin D, Ergocalciferol, (DRISDOL) 1.25 MG (50000 UNIT) CAPS capsule Take 1 capsule (50,000 Units total) by mouth every 7 (seven) days.  . [DISCONTINUED] escitalopram (LEXAPRO) 20 MG tablet Take 1 tablet (20 mg total) by mouth at bedtime.  . [DISCONTINUED] olmesartan (BENICAR) 20 MG tablet Take 0.5 tablets (10 mg total) by mouth at bedtime. Increase to 1 tablet (20 mg total) if BP >140/90 for more  than 1 week    Allergies  Allergen Reactions  . Codeine Anaphylaxis    Patient Active Problem List   Diagnosis Date Noted  . History of kidney stones 12/26/2017    Family History  Problem Relation Age of Onset  . Hypertension Mother   . Diabetes Mother   . High blood pressure Mother   . Obesity Mother   . Hypertension Sister   . Heart attack Maternal Grandfather   . High blood pressure Maternal Grandfather   . Bone cancer Paternal Grandfather   . Hyperlipidemia Father   . Cancer Father     Social History   Tobacco Use  Smoking Status Never Smoker  Smokeless Tobacco Never Used    Past Surgical History:  Procedure Laterality Date  . KIDNEY STONE SURGERY  05/2008    Immunization History  Administered Date(s) Administered  . Influenza-Unspecified 08/03/2017  . PFIZER(Purple Top)SARS-COV-2 Vaccination 12/30/2019, 01/20/2020    Recent Results (from the past 2160 hour(s))  CBC     Status: Abnormal   Collection Time: 01/30/21  9:55 AM  Result Value Ref Range   WBC 7.6 3.8 - 10.8 Thousand/uL   RBC 4.65 3.80 - 5.10 Million/uL   Hemoglobin 15.3 11.7 - 15.5 g/dL   HCT 45.2 (H) 35.0 - 45.0 %   MCV 97.2 80.0 - 100.0 fL   MCH 32.9 27.0 - 33.0 pg   MCHC 33.8 32.0 - 36.0 g/dL   RDW 12.4 11.0 - 15.0 %   Platelets 242 140 - 400 Thousand/uL   MPV 11.3 7.5 - 12.5 fL  COMPLETE METABOLIC PANEL WITH GFR     Status: Abnormal   Collection Time: 01/30/21  9:55 AM  Result Value Ref Range   Glucose, Bld 92 65 - 139 mg/dL    Comment: .        Non-fasting reference interval .    BUN 9 7 - 25 mg/dL   Creat 0.71 0.50 - 1.10 mg/dL   GFR, Est Non African American 104 > OR = 60 mL/min/1.48m   GFR, Est African American 120 > OR = 60 mL/min/1.723m  BUN/Creatinine Ratio NOT APPLICABLE 6 - 22 (calc)   Sodium 140 135 - 146 mmol/L   Potassium 4.4 3.5 - 5.3 mmol/L   Chloride 104 98 - 110 mmol/L   CO2 27 20 - 32 mmol/L   Calcium 9.4 8.6 - 10.2 mg/dL   Total Protein 6.7 6.1 - 8.1  g/dL   Albumin 4.3 3.6 - 5.1 g/dL   Globulin 2.4 1.9 -  3.7 g/dL (calc)   AG Ratio 1.8 1.0 - 2.5 (calc)   Total Bilirubin 1.5 (H) 0.2 - 1.2 mg/dL   Alkaline phosphatase (APISO) 76 31 - 125 U/L   AST 15 10 - 30 U/L   ALT 14 6 - 29 U/L  BASIC METABOLIC PANEL WITH GFR     Status: None   Collection Time: 02/27/21  8:42 AM  Result Value Ref Range   Glucose, Bld 89 65 - 99 mg/dL    Comment: .            Fasting reference interval .    BUN 11 7 - 25 mg/dL   Creat 0.68 0.50 - 1.10 mg/dL   GFR, Est Non African American 106 > OR = 60 mL/min/1.6m   GFR, Est African American 123 > OR = 60 mL/min/1.773m  BUN/Creatinine Ratio NOT APPLICABLE 6 - 22 (calc)   Sodium 143 135 - 146 mmol/L   Potassium 4.1 3.5 - 5.3 mmol/L   Chloride 106 98 - 110 mmol/L   CO2 28 20 - 32 mmol/L   Calcium 9.0 8.6 - 10.2 mg/dL  Vitamin B12     Status: None   Collection Time: 03/22/21 11:55 AM  Result Value Ref Range   Vitamin B-12 232 232 - 1,245 pg/mL  CBC with Differential/Platelet     Status: Abnormal   Collection Time: 03/22/21 11:55 AM  Result Value Ref Range   WBC 8.2 3.4 - 10.8 x10E3/uL   RBC 4.62 3.77 - 5.28 x10E6/uL   Hemoglobin 15.0 11.1 - 15.9 g/dL   Hematocrit 45.2 34.0 - 46.6 %   MCV 98 (H) 79 - 97 fL   MCH 32.5 26.6 - 33.0 pg   MCHC 33.2 31.5 - 35.7 g/dL   RDW 12.5 11.7 - 15.4 %   Platelets 229 150 - 450 x10E3/uL   Neutrophils 63 Not Estab. %   Lymphs 25 Not Estab. %   Monocytes 8 Not Estab. %   Eos 2 Not Estab. %   Basos 1 Not Estab. %   Neutrophils Absolute 5.2 1.4 - 7.0 x10E3/uL   Lymphocytes Absolute 2.1 0.7 - 3.1 x10E3/uL   Monocytes Absolute 0.6 0.1 - 0.9 x10E3/uL   EOS (ABSOLUTE) 0.2 0.0 - 0.4 x10E3/uL   Basophils Absolute 0.1 0.0 - 0.2 x10E3/uL   Immature Granulocytes 1 Not Estab. %   Immature Grans (Abs) 0.1 0.0 - 0.1 x10E3/uL  Comprehensive metabolic panel     Status: None   Collection Time: 03/22/21 11:55 AM  Result Value Ref Range   Glucose 83 65 - 99 mg/dL   BUN 10 6 -  24 mg/dL   Creatinine, Ser 0.69 0.57 - 1.00 mg/dL   eGFR 110 >59 mL/min/1.73   BUN/Creatinine Ratio 14 9 - 23   Sodium 142 134 - 144 mmol/L   Potassium 4.7 3.5 - 5.2 mmol/L   Chloride 102 96 - 106 mmol/L   CO2 21 20 - 29 mmol/L   Calcium 8.9 8.7 - 10.2 mg/dL   Total Protein 6.6 6.0 - 8.5 g/dL   Albumin 4.1 3.8 - 4.8 g/dL   Globulin, Total 2.5 1.5 - 4.5 g/dL   Albumin/Globulin Ratio 1.6 1.2 - 2.2   Bilirubin Total 0.5 0.0 - 1.2 mg/dL   Alkaline Phosphatase 98 44 - 121 IU/L   AST 18 0 - 40 IU/L   ALT 12 0 - 32 IU/L  Folate     Status: None   Collection Time: 03/22/21  11:55 AM  Result Value Ref Range   Folate 6.4 >3.0 ng/mL    Comment: A serum folate concentration of less than 3.1 ng/mL is considered to represent clinical deficiency.   Hemoglobin A1c     Status: None   Collection Time: 03/22/21 11:55 AM  Result Value Ref Range   Hgb A1c MFr Bld 5.3 4.8 - 5.6 %    Comment:          Prediabetes: 5.7 - 6.4          Diabetes: >6.4          Glycemic control for adults with diabetes: <7.0    Est. average glucose Bld gHb Est-mCnc 105 mg/dL  Insulin, random     Status: None   Collection Time: 03/22/21 11:55 AM  Result Value Ref Range   INSULIN 16.6 2.6 - 24.9 uIU/mL  Lipid Panel With LDL/HDL Ratio     Status: Abnormal   Collection Time: 03/22/21 11:55 AM  Result Value Ref Range   Cholesterol, Total 192 100 - 199 mg/dL   Triglycerides 87 0 - 149 mg/dL   HDL 61 >39 mg/dL   VLDL Cholesterol Cal 16 5 - 40 mg/dL   LDL Chol Calc (NIH) 115 (H) 0 - 99 mg/dL   LDL/HDL Ratio 1.9 0.0 - 3.2 ratio    Comment:                                     LDL/HDL Ratio                                             Men  Women                               1/2 Avg.Risk  1.0    1.5                                   Avg.Risk  3.6    3.2                                2X Avg.Risk  6.2    5.0                                3X Avg.Risk  8.0    6.1   T4     Status: None   Collection Time: 03/22/21 11:55 AM   Result Value Ref Range   T4, Total 6.3 4.5 - 12.0 ug/dL  T3     Status: None   Collection Time: 03/22/21 11:55 AM  Result Value Ref Range   T3, Total 95 71 - 180 ng/dL  TSH     Status: None   Collection Time: 03/22/21 11:55 AM  Result Value Ref Range   TSH 1.070 0.450 - 4.500 uIU/mL  VITAMIN D 25 Hydroxy (Vit-D Deficiency, Fractures)     Status: None   Collection Time: 03/22/21 11:55 AM  Result Value Ref Range   Vit D, 25-Hydroxy 42.8 30.0 - 100.0 ng/mL  Comment: Vitamin D deficiency has been defined by the [place] practice guideline as a level of serum 25-OH vitamin D less than 20 ng/mL (1,2). The Endocrine Society went on to further define vitamin D insufficiency as a level between 21 and 29 ng/mL (2). 1. IOM (Institute of Medicine). 2010. Dietary reference    intakes for calcium and D. [place]: The    [employer]. 2. Holick MF, Binkley Heimdal, Bischoff-Ferrari HA, et al.    Evaluation, treatment, and prevention of vitamin D    deficiency: an Endocrine Society clinical practice    guideline. JCEM. 2011 Jul; 96(7):1911-30.     No results found.     All questions at time of visit were answered - patient instructed to contact office with any additional concerns or updates. ER/RTC precautions were reviewed with the patient as applicable.   Please note: manual typing as well as voice recognition software may have been used to produce this document - typos may escape review. Please contact Dr. Sheppard Coil for any needed clarifications.

## 2021-05-04 ENCOUNTER — Other Ambulatory Visit: Payer: Self-pay

## 2021-05-04 ENCOUNTER — Encounter (INDEPENDENT_AMBULATORY_CARE_PROVIDER_SITE_OTHER): Payer: Self-pay | Admitting: Family Medicine

## 2021-05-04 ENCOUNTER — Ambulatory Visit (INDEPENDENT_AMBULATORY_CARE_PROVIDER_SITE_OTHER): Payer: Managed Care, Other (non HMO) | Admitting: Family Medicine

## 2021-05-04 VITALS — BP 129/76 | HR 72 | Temp 98.3°F | Ht 66.0 in | Wt 196.0 lb

## 2021-05-04 DIAGNOSIS — Z9189 Other specified personal risk factors, not elsewhere classified: Secondary | ICD-10-CM | POA: Diagnosis not present

## 2021-05-04 DIAGNOSIS — E8881 Metabolic syndrome: Secondary | ICD-10-CM | POA: Diagnosis not present

## 2021-05-04 DIAGNOSIS — Z6831 Body mass index (BMI) 31.0-31.9, adult: Secondary | ICD-10-CM

## 2021-05-04 DIAGNOSIS — E669 Obesity, unspecified: Secondary | ICD-10-CM

## 2021-05-04 DIAGNOSIS — F3289 Other specified depressive episodes: Secondary | ICD-10-CM

## 2021-05-04 MED ORDER — BUPROPION HCL ER (SR) 150 MG PO TB12: 150.0000 mg | ORAL_TABLET | Freq: Every day | ORAL | 0 refills | Status: DC

## 2021-05-04 MED ORDER — METFORMIN HCL 500 MG PO TABS: 500.0000 mg | ORAL_TABLET | Freq: Every day | ORAL | 0 refills | Status: DC

## 2021-05-10 NOTE — Progress Notes (Signed)
Chief Complaint:   OBESITY Alison Lucas is here to discuss her progress with her obesity treatment plan along with follow-up of her obesity related diagnoses. Alison Lucas is on keeping a food journal and adhering to recommended goals of 1400-1600 calories and 85 g protein and states she is following her eating plan approximately 75% of the time. Alison Lucas states she is walking 45 minutes 3 times per week.  Today's visit was #: 4 Starting weight: 197 lbs Starting date: 03/22/2021 Today's weight: 196 lbs Today's date: 05/04/2021 Total lbs lost to date: 1 Total lbs lost since last in-office visit: 1  Interim History: Alison Lucas feels stuck- has only lost 3 lbs since January. Breakfast- [employer]life milk with coffee for breakfast; Lunch- deli meat 4 slices with cheese with fruit; Dinner- chicken or fish and cauliflower. She isn't logging on [employer]itness[employer]. She normally journals breakfast and lunch but struggles at dinner and afterwards.  Subjective:   1. Other depression with emotional eating Pt denies suicidal or homicidal ideations. Alison Lucas is on Wellbutrin. She reports some improvement in symptoms.  2. Insulin resistance Alison Lucas is on Metformin with no GI side effects. Her last A1c was 5.3 and insulin level 16.6.  3. At risk for diabetes mellitus Alison Lucas is at higher than average risk for developing diabetes due to obesity.   Assessment/Plan:   1. Other depression with emotional eating Behavior modification techniques were discussed today to help Alison Lucas deal with her emotional/non-hunger eating behaviors.  Orders and follow up as documented in patient record.   - buPROPion (WELLBUTRIN SR) 150 MG 12 hr tablet; Take 1 tablet (150 mg total) by mouth daily with breakfast.  Dispense: 30 tablet; Refill: 0  2. Insulin resistance Alison Lucas will continue to work on weight loss, exercise, and decreasing simple carbohydrates to help decrease the risk of diabetes. Alison Lucas agreed to follow-up with [place] as directed to closely monitor  her progress.  - metFORMIN (GLUCOPHAGE) 500 MG tablet; Take 1 tablet (500 mg total) by mouth daily with breakfast.  Dispense: 30 tablet; Refill: 0  3. At risk for diabetes mellitus Alison Lucas was given approximately 15 minutes of diabetes education and counseling today. We discussed intensive lifestyle modifications today with an emphasis on weight loss as well as increasing exercise and decreasing simple carbohydrates in her diet. We also reviewed medication options with an emphasis on risk versus benefit of those discussed.   Repetitive spaced learning was employed today to elicit superior memory formation and behavioral change.  4. Obesity with current BMI 31.9  Alison Lucas is currently in the action stage of change. As such, her goal is to continue with weight loss efforts. She has agreed to keeping a food journal and adhering to recommended goals of 1400-1600 calories and 90+ g protein.   Exercise goals:  As is  Behavioral modification strategies: increasing lean protein intake, meal planning and cooking strategies, keeping healthy foods in the home and celebration eating strategies.  Alison Lucas has agreed to follow-up with our clinic in 2-3 weeks. She was informed of the importance of frequent follow-up visits to maximize her success with intensive lifestyle modifications for her multiple health conditions.   Objective:   Blood pressure 129/76, pulse 72, temperature 98.3 F (36.8 C), height 5\' 6"  (1.676 m), weight 196 lb (88.9 kg), SpO2 95 %. Body mass index is 31.64 kg/m.  General: Cooperative, alert, well developed, in no acute distress. HEENT: Conjunctivae and lids unremarkable. Cardiovascular: Regular rhythm.  Lungs: Normal work of breathing. Neurologic: No focal deficits.  Lab Results  Component Value Date   CREATININE 0.69 03/22/2021   BUN 10 03/22/2021   NA 142 03/22/2021   K 4.7 03/22/2021   CL 102 03/22/2021   CO2 21 03/22/2021   Lab Results  Component Value Date   ALT 12  03/22/2021   AST 18 03/22/2021   ALKPHOS 98 03/22/2021   BILITOT 0.5 03/22/2021   Lab Results  Component Value Date   HGBA1C 5.3 03/22/2021   Lab Results  Component Value Date   INSULIN 16.6 03/22/2021   Lab Results  Component Value Date   TSH 1.070 03/22/2021   Lab Results  Component Value Date   CHOL 192 03/22/2021   HDL 61 03/22/2021   LDLCALC 115 (H) 03/22/2021   TRIG 87 03/22/2021   CHOLHDL 4.3 12/26/2017   Lab Results  Component Value Date   WBC 8.2 03/22/2021   HGB 15.0 03/22/2021   HCT 45.2 03/22/2021   MCV 98 (H) 03/22/2021   PLT 229 03/22/2021   No results found for: IRON, TIBC, FERRITIN   Attestation Statements:   Reviewed by clinician on day of visit: allergies, medications, problem list, medical history, surgical history, family history, social history, and previous encounter notes.  Edmund Hilda, CMA, am acting as transcriptionist for Reuben Likes, MD.   I have reviewed the above documentation for accuracy and completeness, and I agree with the above. - Katherina Mires, MD

## 2021-05-22 ENCOUNTER — Ambulatory Visit (INDEPENDENT_AMBULATORY_CARE_PROVIDER_SITE_OTHER): Payer: Managed Care, Other (non HMO) | Admitting: Family Medicine

## 2021-05-22 ENCOUNTER — Other Ambulatory Visit: Payer: Self-pay

## 2021-05-22 ENCOUNTER — Encounter (INDEPENDENT_AMBULATORY_CARE_PROVIDER_SITE_OTHER): Payer: Self-pay | Admitting: Family Medicine

## 2021-05-22 VITALS — BP 123/72 | HR 72 | Temp 98.2°F | Ht 66.0 in | Wt 197.0 lb

## 2021-05-22 DIAGNOSIS — E669 Obesity, unspecified: Secondary | ICD-10-CM

## 2021-05-22 DIAGNOSIS — F3289 Other specified depressive episodes: Secondary | ICD-10-CM | POA: Diagnosis not present

## 2021-05-22 DIAGNOSIS — E8881 Metabolic syndrome: Secondary | ICD-10-CM | POA: Diagnosis not present

## 2021-05-22 DIAGNOSIS — Z6831 Body mass index (BMI) 31.0-31.9, adult: Secondary | ICD-10-CM | POA: Diagnosis not present

## 2021-05-22 DIAGNOSIS — Z9189 Other specified personal risk factors, not elsewhere classified: Secondary | ICD-10-CM | POA: Diagnosis not present

## 2021-05-23 NOTE — Progress Notes (Signed)
Chief Complaint:   OBESITY Alison Lucas is here to discuss her progress with her obesity treatment plan along with follow-up of her obesity related diagnoses. Alison Lucas is on keeping a food journal and adhering to recommended goals of 1400-1600 calories and 90+ grams of protein daily and states she is following her eating plan approximately 75% of the time. Alison Lucas states she is walking for 45 minutes 4 times per week.  Today's visit was #: 5 Starting weight: 197 lbs Starting date: 03/22/2021 Today's weight: 197 lbs Today's date: 05/22/2021 Total lbs lost to date: 0 Total lbs lost since last in-office visit: 0  Interim History: Alison Lucas continues to work on diet, exercise, and weight loss. She had multiple social eating situations where she avoided carbohydrates and sugar but felt deprived. She is open to looking at other medication options to help her.  Subjective:   1. Insulin resistance Alison Lucas is stable on metformin, and she is doing well with diet. She denies nausea or vomiting. She is open to starting Saxenda both for weight loss and for insulin improvement.  2. Other depression with emotional eating Alison Lucas is stable on her medications and she is working on decreasing emotional eating behaviors, but she feels deprived at times when she makes better choices.  3. At risk for dehydration Alison Lucas is at risk for dehydration due to inadequate water intake.  Assessment/Plan:   1. Insulin resistance Alison Lucas will hold metformin when she started Saxenda. She will continue to work on diet, exercise, and decreasing simple carbohydrates to help decrease the risk of diabetes. Alison Lucas agreed to follow-up with [place] as directed to closely monitor her progress.  2. Other depression with emotional eating Behavior modification techniques were discussed today to help Alison Lucas deal with her emotional/non-hunger eating behaviors. Alison Lucas will continue her medications as is, and starting Alison Lucas can help with her with feeling  more satisfied with her eating plan. Orders and follow up as documented in patient record.   3. At risk for dehydration Alison Lucas was given approximately 15 minutes dehydration prevention counseling today. Alison Lucas is at risk for dehydration due to weight loss and current medication(s). She was encouraged to hydrate and monitor fluid status to avoid dehydration as well as weight loss plateaus.   4. Obesity with current BMI 31.8 Alison Lucas is currently in the action stage of change. As such, her goal is to continue with weight loss efforts. She has agreed to keeping a food journal and adhering to recommended goals of 1400-1600 calories and 90+ grams of protein daily.   We discussed various medication options to help Alison Lucas with her weight loss efforts and we both agreed to start Saxenda 3 mg q weekly with no refills #5 pens.  Exercise goals: As is.  Behavioral modification strategies: increasing lean protein intake and emotional eating strategies.  Alison Lucas has agreed to follow-up with our clinic in 2 to 3 weeks. She was informed of the importance of frequent follow-up visits to maximize her success with intensive lifestyle modifications for her multiple health conditions.   Objective:   Blood pressure 123/72, pulse 72, temperature 98.2 F (36.8 C), height 5\' 6"  (1.676 m), weight 197 lb (89.4 kg), SpO2 99 %. Body mass index is 31.8 kg/m.  General: Cooperative, alert, well developed, in no acute distress. HEENT: Conjunctivae and lids unremarkable. Cardiovascular: Regular rhythm.  Lungs: Normal work of breathing. Neurologic: No focal deficits.   Lab Results  Component Value Date   CREATININE 0.69 03/22/2021   BUN 10 03/22/2021  NA 142 03/22/2021   K 4.7 03/22/2021   CL 102 03/22/2021   CO2 21 03/22/2021   Lab Results  Component Value Date   ALT 12 03/22/2021   AST 18 03/22/2021   ALKPHOS 98 03/22/2021   BILITOT 0.5 03/22/2021   Lab Results  Component Value Date   HGBA1C 5.3 03/22/2021    Lab Results  Component Value Date   INSULIN 16.6 03/22/2021   Lab Results  Component Value Date   TSH 1.070 03/22/2021   Lab Results  Component Value Date   CHOL 192 03/22/2021   HDL 61 03/22/2021   LDLCALC 115 (H) 03/22/2021   TRIG 87 03/22/2021   CHOLHDL 4.3 12/26/2017   Lab Results  Component Value Date   WBC 8.2 03/22/2021   HGB 15.0 03/22/2021   HCT 45.2 03/22/2021   MCV 98 (H) 03/22/2021   PLT 229 03/22/2021   No results found for: IRON, TIBC, FERRITIN  Attestation Statements:   Reviewed by clinician on day of visit: allergies, medications, problem list, medical history, surgical history, family history, social history, and previous encounter notes.   I, Burt Knack, am acting as transcriptionist for Quillian Quince, MD.  I have reviewed the above documentation for accuracy and completeness, and I agree with the above. -  Quillian Quince, MD

## 2021-05-29 ENCOUNTER — Encounter (INDEPENDENT_AMBULATORY_CARE_PROVIDER_SITE_OTHER): Payer: Self-pay | Admitting: Family Medicine

## 2021-05-29 NOTE — Telephone Encounter (Signed)
Pt last seen by Dr. Beasley.  

## 2021-05-30 MED ORDER — SAXENDA 18 MG/3ML ~~LOC~~ SOPN: 3.0000 mg | PEN_INJECTOR | SUBCUTANEOUS | 0 refills | Status: DC

## 2021-06-01 ENCOUNTER — Other Ambulatory Visit (INDEPENDENT_AMBULATORY_CARE_PROVIDER_SITE_OTHER): Payer: Self-pay | Admitting: Emergency Medicine

## 2021-06-01 DIAGNOSIS — E669 Obesity, unspecified: Secondary | ICD-10-CM

## 2021-06-01 MED ORDER — SAXENDA 18 MG/3ML ~~LOC~~ SOPN: 3.0000 mg | PEN_INJECTOR | Freq: Every day | SUBCUTANEOUS | 0 refills | Status: DC

## 2021-06-01 NOTE — Telephone Encounter (Signed)
Last OV with Dr. Beasley 

## 2021-06-13 ENCOUNTER — Ambulatory Visit (INDEPENDENT_AMBULATORY_CARE_PROVIDER_SITE_OTHER): Payer: Managed Care, Other (non HMO) | Admitting: Family Medicine

## 2021-06-13 ENCOUNTER — Encounter (INDEPENDENT_AMBULATORY_CARE_PROVIDER_SITE_OTHER): Payer: Self-pay | Admitting: Family Medicine

## 2021-06-13 ENCOUNTER — Other Ambulatory Visit: Payer: Self-pay

## 2021-06-13 VITALS — BP 140/87 | HR 62 | Temp 98.3°F | Ht 66.0 in | Wt 198.0 lb

## 2021-06-13 DIAGNOSIS — Z6831 Body mass index (BMI) 31.0-31.9, adult: Secondary | ICD-10-CM

## 2021-06-13 DIAGNOSIS — Z9189 Other specified personal risk factors, not elsewhere classified: Secondary | ICD-10-CM

## 2021-06-13 DIAGNOSIS — F3289 Other specified depressive episodes: Secondary | ICD-10-CM

## 2021-06-13 DIAGNOSIS — R739 Hyperglycemia, unspecified: Secondary | ICD-10-CM

## 2021-06-13 DIAGNOSIS — E559 Vitamin D deficiency, unspecified: Secondary | ICD-10-CM

## 2021-06-13 DIAGNOSIS — E669 Obesity, unspecified: Secondary | ICD-10-CM | POA: Diagnosis not present

## 2021-06-13 MED ORDER — BD PEN NEEDLE NANO U/F 32G X 4 MM MISC: 0 refills | Status: AC

## 2021-06-13 MED ORDER — VITAMIN D (ERGOCALCIFEROL) 1.25 MG (50000 UNIT) PO CAPS: 50000.0000 [IU] | ORAL_CAPSULE | ORAL | 0 refills | Status: AC

## 2021-06-13 MED ORDER — METFORMIN HCL 500 MG PO TABS: 500.0000 mg | ORAL_TABLET | Freq: Every day | ORAL | 0 refills | Status: AC

## 2021-06-13 MED ORDER — VICTOZA 18 MG/3ML ~~LOC~~ SOPN: 0.6000 mg | PEN_INJECTOR | Freq: Every day | SUBCUTANEOUS | 0 refills | Status: AC

## 2021-06-13 MED ORDER — BUPROPION HCL ER (SR) 150 MG PO TB12: 150.0000 mg | ORAL_TABLET | Freq: Every day | ORAL | 0 refills | Status: AC

## 2021-06-16 ENCOUNTER — Encounter (INDEPENDENT_AMBULATORY_CARE_PROVIDER_SITE_OTHER): Payer: Self-pay | Admitting: Family Medicine

## 2021-06-21 NOTE — Telephone Encounter (Signed)
Pt last seen by Dr. Beasley.  

## 2021-06-21 NOTE — Progress Notes (Signed)
Chief Complaint:   OBESITY Alison Lucas is here to discuss her progress with her obesity treatment plan along with follow-up of her obesity related diagnoses. Alison Lucas is on keeping a food journal and adhering to recommended goals of 1400-1600 calories and 90+ grams of protein daily and states she is following her eating plan approximately 50% of the time. Alison Lucas states she is doing 0 minutes 0 times per week.  Today's visit was #: 6 Starting weight: 197 lbs Starting date: 03/22/2021 Today's weight: 198 lbs Today's date: 06/13/2021 Total lbs lost to date: 0 Total lbs lost since last in-office visit: 0  Interim History: Alison Lucas has been struggling more with weight loss. We prescribed Saxenda at her last visit, but her insurance denied this. Her hunger has been an issue and she is frustrated with this.  Subjective:   1. Vitamin D deficiency Alzada is stable on Vit D, and she requests a refill.  2. Hyperglycemia Amahia has hyperglycemia with insulin resistance. She would benefit from a GLP-1, but her insurance refused to cover Saxenda.  3. Other depression with emotional eating Alison Lucas is stable on Wellbutrin SR, and she is working on decreasing emotional eating behaviors.  4. At risk for heart disease Alison Lucas is at a higher than average risk for cardiovascular disease due to obesity.   Assessment/Plan:   1. Vitamin D deficiency Low Vitamin D level contributes to fatigue and are associated with obesity, breast, and colon cancer. We will refill prescription Vitamin D for 1 month. Marvel will follow-up for routine testing of Vitamin D, at least 2-3 times per year to avoid over-replacement.  - Vitamin D, Ergocalciferol, (DRISDOL) 1.25 MG (50000 UNIT) CAPS capsule; Take 1 capsule (50,000 Units total) by mouth every 7 (seven) days.  Dispense: 4 capsule; Refill: 0  2. Hyperglycemia Cardiovascular risk and specific lipid/LDL goals reviewed. We discussed several lifestyle modifications today. Valicia  agreed to start Victoza at 0.6 mg q daily with no refills, with nano needles #100 with no refills; and we will refill metformin for 1 month. She will continue to work on diet, exercise and weight loss efforts. Orders and follow up as documented in patient record.   - metFORMIN (GLUCOPHAGE) 500 MG tablet; Take 1 tablet (500 mg total) by mouth daily with breakfast.  Dispense: 30 tablet; Refill: 0 - liraglutide (VICTOZA) 18 MG/3ML SOPN; Inject 0.6 mg into the skin daily.  Dispense: 3 mL; Refill: 0 - Insulin Pen Needle (BD PEN NEEDLE NANO U/F) 32G X 4 MM MISC; Use Nano needle with Ozempic  Dispense: 100 each; Refill: 0  3. Other depression with emotional eating Behavior modification techniques were discussed today to help Seraphim deal with her emotional/non-hunger eating behaviors. We will refill Wellbutrin SR for 1 month. Orders and follow up as documented in patient record.   - buPROPion (WELLBUTRIN SR) 150 MG 12 hr tablet; Take 1 tablet (150 mg total) by mouth daily with breakfast.  Dispense: 30 tablet; Refill: 0  4. At risk for heart disease Reatha was given approximately 15 minutes of coronary artery disease prevention counseling today. She is 45 y.o. female and has risk factors for heart disease including obesity. We discussed intensive lifestyle modifications today with an emphasis on specific weight loss instructions and strategies.   Repetitive spaced learning was employed today to elicit superior memory formation and behavioral change.  5. Obesity with current BMI 32.0 Alison Lucas is currently in the action stage of change. As such, her goal is to continue with  weight loss efforts. She has agreed to change to the Category 2 Plan.   We will recheck fasting labs at her next visit.  Behavioral modification strategies: increasing lean protein intake and no skipping meals.  Sugar has agreed to follow-up with our clinic in 3 weeks. She was informed of the importance of frequent follow-up visits to  maximize her success with intensive lifestyle modifications for her multiple health conditions.   Objective:   Blood pressure 140/87, pulse 62, temperature 98.3 F (36.8 C), height 5\' 6"  (1.676 m), weight 198 lb (89.8 kg), SpO2 99 %. Body mass index is 31.96 kg/m.  General: Cooperative, alert, well developed, in no acute distress. HEENT: Conjunctivae and lids unremarkable. Cardiovascular: Regular rhythm.  Lungs: Normal work of breathing. Neurologic: No focal deficits.   Lab Results  Component Value Date   CREATININE 0.69 03/22/2021   BUN 10 03/22/2021   NA 142 03/22/2021   K 4.7 03/22/2021   CL 102 03/22/2021   CO2 21 03/22/2021   Lab Results  Component Value Date   ALT 12 03/22/2021   AST 18 03/22/2021   ALKPHOS 98 03/22/2021   BILITOT 0.5 03/22/2021   Lab Results  Component Value Date   HGBA1C 5.3 03/22/2021   Lab Results  Component Value Date   INSULIN 16.6 03/22/2021   Lab Results  Component Value Date   TSH 1.070 03/22/2021   Lab Results  Component Value Date   CHOL 192 03/22/2021   HDL 61 03/22/2021   LDLCALC 115 (H) 03/22/2021   TRIG 87 03/22/2021   CHOLHDL 4.3 12/26/2017   Lab Results  Component Value Date   VD25OH 42.8 03/22/2021   VD25OH 42 12/26/2017   Lab Results  Component Value Date   WBC 8.2 03/22/2021   HGB 15.0 03/22/2021   HCT 45.2 03/22/2021   MCV 98 (H) 03/22/2021   PLT 229 03/22/2021   No results found for: IRON, TIBC, FERRITIN  Attestation Statements:   Reviewed by clinician on day of visit: allergies, medications, problem list, medical history, surgical history, family history, social history, and previous encounter notes.   I, 03/24/2021, am acting as transcriptionist for Burt Knack, MD.  I have reviewed the above documentation for accuracy and completeness, and I agree with the above. -   Quillian Quince, MD

## 2021-07-05 ENCOUNTER — Ambulatory Visit (INDEPENDENT_AMBULATORY_CARE_PROVIDER_SITE_OTHER): Payer: Managed Care, Other (non HMO) | Admitting: Family Medicine

## 2022-01-08 ENCOUNTER — Other Ambulatory Visit (INDEPENDENT_AMBULATORY_CARE_PROVIDER_SITE_OTHER): Payer: Self-pay | Admitting: Family Medicine

## 2022-01-08 DIAGNOSIS — R739 Hyperglycemia, unspecified: Secondary | ICD-10-CM

## 2022-01-08 NOTE — Telephone Encounter (Signed)
LAST APPOINTMENT DATE: 06/16/21 NEXT APPOINTMENT DATE: Pt not scheduled.   [id number] - [place], [place] - [address] [place] [address] Phone: [phone] Fax: [phone]  Patient is requesting a refill of the following medications: Requested Prescriptions   Pending Prescriptions Disp Refills   metFORMIN (GLUCOPHAGE) 500 MG tablet [Pharmacy Med Name: METFORMIN 500MG  TABLETS] 30 tablet 0    Sig: TAKE 1 TABLET(500 MG) BY MOUTH DAILY WITH BREAKFAST    Date last filled: 06/13/21 Previously prescribed by Dr. 08/14/21  Lab Results  Component Value Date   HGBA1C 5.3 03/22/2021   Lab Results  Component Value Date   LDLCALC 115 (H) 03/22/2021   CREATININE 0.69 03/22/2021   Lab Results  Component Value Date   VD25OH 42.8 03/22/2021   VD25OH 42 12/26/2017    BP Readings from Last 3 Encounters:  06/13/21 140/87  05/22/21 123/72  05/04/21 129/76

## 2022-07-11 ENCOUNTER — Encounter (INDEPENDENT_AMBULATORY_CARE_PROVIDER_SITE_OTHER): Payer: Self-pay
